# Patient Record
Sex: Male | Born: 1985 | Race: White | Hispanic: No | Marital: Married | Smoking: Never smoker
Health system: Southern US, Community
[De-identification: ages and names within clinical notes are randomized; demographics above are authoritative.]

## PROBLEM LIST (undated history)

## (undated) HISTORY — PX: APPENDECTOMY: SHX54

---

## 2019-05-18 DIAGNOSIS — M79671 Pain in right foot: Secondary | ICD-10-CM | POA: Diagnosis not present

## 2019-05-18 DIAGNOSIS — Z6824 Body mass index (BMI) 24.0-24.9, adult: Secondary | ICD-10-CM | POA: Diagnosis not present

## 2019-08-23 DIAGNOSIS — Z20828 Contact with and (suspected) exposure to other viral communicable diseases: Secondary | ICD-10-CM | POA: Diagnosis not present

## 2019-11-23 DIAGNOSIS — Z6823 Body mass index (BMI) 23.0-23.9, adult: Secondary | ICD-10-CM | POA: Diagnosis not present

## 2019-11-23 DIAGNOSIS — Z0000 Encounter for general adult medical examination without abnormal findings: Secondary | ICD-10-CM | POA: Diagnosis not present

## 2019-11-24 DIAGNOSIS — Z136 Encounter for screening for cardiovascular disorders: Secondary | ICD-10-CM | POA: Diagnosis not present

## 2019-11-24 DIAGNOSIS — Z8249 Family history of ischemic heart disease and other diseases of the circulatory system: Secondary | ICD-10-CM | POA: Diagnosis not present

## 2019-11-24 DIAGNOSIS — Z13220 Encounter for screening for lipoid disorders: Secondary | ICD-10-CM | POA: Diagnosis not present

## 2019-11-24 DIAGNOSIS — Z79899 Other long term (current) drug therapy: Secondary | ICD-10-CM | POA: Diagnosis not present

## 2019-12-15 DIAGNOSIS — Z3141 Encounter for fertility testing: Secondary | ICD-10-CM | POA: Diagnosis not present

## 2020-01-17 DIAGNOSIS — Z23 Encounter for immunization: Secondary | ICD-10-CM | POA: Diagnosis not present

## 2020-02-15 DIAGNOSIS — Z23 Encounter for immunization: Secondary | ICD-10-CM | POA: Diagnosis not present

## 2020-03-22 DIAGNOSIS — L821 Other seborrheic keratosis: Secondary | ICD-10-CM | POA: Diagnosis not present

## 2020-03-22 DIAGNOSIS — D485 Neoplasm of uncertain behavior of skin: Secondary | ICD-10-CM | POA: Diagnosis not present

## 2020-03-22 DIAGNOSIS — L988 Other specified disorders of the skin and subcutaneous tissue: Secondary | ICD-10-CM | POA: Diagnosis not present

## 2020-03-26 DIAGNOSIS — Z0183 Encounter for blood typing: Secondary | ICD-10-CM | POA: Diagnosis not present

## 2020-03-26 DIAGNOSIS — Z113 Encounter for screening for infections with a predominantly sexual mode of transmission: Secondary | ICD-10-CM | POA: Diagnosis not present

## 2020-04-11 DIAGNOSIS — Z6823 Body mass index (BMI) 23.0-23.9, adult: Secondary | ICD-10-CM | POA: Diagnosis not present

## 2020-04-11 DIAGNOSIS — R202 Paresthesia of skin: Secondary | ICD-10-CM | POA: Diagnosis not present

## 2020-05-29 DIAGNOSIS — Z113 Encounter for screening for infections with a predominantly sexual mode of transmission: Secondary | ICD-10-CM | POA: Diagnosis not present

## 2020-07-12 DIAGNOSIS — E538 Deficiency of other specified B group vitamins: Secondary | ICD-10-CM | POA: Diagnosis not present

## 2020-08-06 ENCOUNTER — Other Ambulatory Visit: Payer: Self-pay

## 2020-08-06 ENCOUNTER — Ambulatory Visit (INDEPENDENT_AMBULATORY_CARE_PROVIDER_SITE_OTHER): Payer: BC Managed Care – PPO | Admitting: Family Medicine

## 2020-08-06 ENCOUNTER — Encounter: Payer: Self-pay | Admitting: Family Medicine

## 2020-08-06 VITALS — BP 120/80 | HR 64 | Ht 75.0 in | Wt 201.0 lb

## 2020-08-06 DIAGNOSIS — S46811A Strain of other muscles, fascia and tendons at shoulder and upper arm level, right arm, initial encounter: Secondary | ICD-10-CM

## 2020-08-06 DIAGNOSIS — Z23 Encounter for immunization: Secondary | ICD-10-CM | POA: Diagnosis not present

## 2020-08-06 DIAGNOSIS — Z7689 Persons encountering health services in other specified circumstances: Secondary | ICD-10-CM | POA: Diagnosis not present

## 2020-08-06 DIAGNOSIS — M238X2 Other internal derangements of left knee: Secondary | ICD-10-CM

## 2020-08-06 MED ORDER — MELOXICAM 15 MG PO TABS: 15.0000 mg | ORAL_TABLET | Freq: Every day | ORAL | 0 refills | Status: DC

## 2020-08-06 NOTE — Progress Notes (Signed)
Date:  08/06/2020   Name:  Troy Logan   DOB:  1986/06/24   MRN:  [id number]   Chief Complaint: Establish Care, Flu Vaccine, Knee Pain, and Neck Pain (not really pain, just a "knot that comes up off and on")  Patient is a 34 year old male who presents for an establish care exam. The patient reports the following problems: posterior cervical "knot" and knee pain. Health maintenance has been reviewed influenza.  Knee Pain  The incident occurred more than 1 week ago. There was no injury mechanism. The pain is present in the left knee. The quality of the pain is described as aching. The pain is moderate. The pain has been intermittent since onset. Pertinent negatives include no inability to bear weight, loss of motion, loss of sensation, muscle weakness, numbness or tingling. The symptoms are aggravated by movement. The treatment provided moderate relief.  Neck Pain  Pertinent negatives include no chest pain, fever, headaches, numbness or tingling.    No results found for: CREATININE, BUN, NA, K, CL, CO2 No results found for: CHOL, HDL, LDLCALC, LDLDIRECT, TRIG, CHOLHDL No results found for: TSH No results found for: HGBA1C No results found for: WBC, HGB, HCT, MCV, PLT No results found for: ALT, AST, GGT, ALKPHOS, BILITOT   Review of Systems  Constitutional: Negative for chills and fever.  HENT: Negative for drooling, ear discharge, ear pain and sore throat.   Respiratory: Negative for cough, shortness of breath and wheezing.   Cardiovascular: Negative for chest pain, palpitations and leg swelling.  Gastrointestinal: Negative for abdominal pain, blood in stool, constipation, diarrhea and nausea.  Endocrine: Negative for polydipsia.  Genitourinary: Negative for dysuria, frequency, hematuria and urgency.  Musculoskeletal: Positive for neck pain. Negative for back pain and myalgias.  Skin: Negative for rash.  Allergic/Immunologic: Negative for environmental allergies.  Neurological:  Negative for dizziness, tingling, numbness and headaches.  Hematological: Does not bruise/bleed easily.  Psychiatric/Behavioral: Negative for suicidal ideas. The patient is not nervous/anxious.     There are no problems to display for this patient.   No Known Allergies    Social History   Tobacco Use  . Smoking status: Never Smoker  . Smokeless tobacco: Never Used  Substance Use Topics  . Alcohol use: Yes    Comment: socially  . Drug use: Not Currently     Medication list has been reviewed and updated.  Current Meds  Medication Sig  . Cyanocobalamin 1000 MCG CAPS Take 1,000 mcg by mouth every other day.     PHQ 2/9 Scores 08/06/2020  PHQ - 2 Score 0  PHQ- 9 Score 0    GAD 7 : Generalized Anxiety Score 08/06/2020  Nervous, Anxious, on Edge 0  Control/stop worrying 0  Worry too much - different things 0  Trouble relaxing 0  Restless 0  Easily annoyed or irritable 0  Afraid - awful might happen 0  Total GAD 7 Score 0    BP Readings from Last 3 Encounters:  08/06/20 120/80    Physical Exam Vitals and nursing note reviewed.  HENT:     Head: Normocephalic.     Right Ear: Tympanic membrane, ear canal and external ear normal.     Left Ear: Tympanic membrane, ear canal and external ear normal.     Nose: Nose normal. No congestion or rhinorrhea.     Mouth/Throat:     Mouth: Mucous membranes are moist.     Pharynx: No oropharyngeal exudate or posterior oropharyngeal  erythema.  Eyes:     General: No scleral icterus.       Right eye: No discharge.        Left eye: No discharge.     Conjunctiva/sclera: Conjunctivae normal.     Pupils: Pupils are equal, round, and reactive to light.  Neck:     Thyroid: No thyromegaly.     Vascular: No JVD.     Trachea: No tracheal deviation.  Cardiovascular:     Rate and Rhythm: Normal rate and regular rhythm.     Heart sounds: Normal heart sounds. No murmur heard.  No friction rub. No gallop.   Pulmonary:     Effort: No  respiratory distress.     Breath sounds: Normal breath sounds. No wheezing, rhonchi or rales.  Abdominal:     General: Bowel sounds are normal.     Palpations: Abdomen is soft. There is no mass.     Tenderness: There is no abdominal tenderness. There is no guarding or rebound.  Musculoskeletal:        General: No tenderness. Normal range of motion.     Cervical back: Normal range of motion and neck supple.  Lymphadenopathy:     Cervical: No cervical adenopathy.  Skin:    General: Skin is warm.     Findings: No bruising, erythema or rash.  Neurological:     Mental Status: He is alert and oriented to person, place, and time.     Cranial Nerves: No cranial nerve deficit.     Deep Tendon Reflexes: Reflexes are normal and symmetric.     Wt Readings from Last 3 Encounters:  08/06/20 201 lb (91.2 kg)    BP 120/80   Pulse 64   Ht 6\' 3"  (1.905 m)   Wt 201 lb (91.2 kg)   BMI 25.12 kg/m   Assessment and Plan: 1. Establishing care with new doctor, encounter for No subjective/objective concerns noted with history and physical exam.  Patient's chart was reviewed for previous encounters, most recent labs, most recent imaging, and care everywhere.  2. Strain of right trapezius muscle, initial encounter Relatively new onset.  Patient has noted a "knot "in the posterior cervical area that comes and goes.  There is no palpable area and this is in a possible posterior chain area but is no palpable adenopathy noted at this time.  I suspect this has more to do with the trapezius and may be an area spasm that is noted by the patient.  Patient will be given meloxicam for concern below and this may also help this as well.  3. Knee crepitus, left New onset.  Patient has a sensation of feeling of his left knee which is positional.  There is no real pain however this could be secondary to some meniscal concern that is starting to develop in early stages.  We will initiate meloxicam 15 mg once a day for  decreasing inflammatory response for a trial basis. - meloxicam (MOBIC) 15 MG tablet; Take 1 tablet (15 mg total) by mouth daily.  Dispense: 30 tablet; Refill: 0  4. Need for immunization against influenza Discussed and administered. - Flu Vaccine QUAD 36+ mos IM

## 2020-08-06 NOTE — Patient Instructions (Signed)
Articular Cartilage Injury  An articular cartilage injury is damage to the cartilage that lines the surface of joints (articular cartilage). Articular cartilage is also called hyaline cartilage. The cartilage is smooth, white tissue that covers the ends of bones where they meet at a joint. This cartilage allows smooth movement of the joint. It also acts as a thin cushion between the bones of the joint. Injuries to joint cartilage can cause pain and decrease range of movement. Articular cartilage damage can occur from a recent (acute) injury or from wear and tear that takes place over time. The knee joint is the most common area for this type of injury. Articular cartilage injuries are also common in the ankle, shoulder, elbow, and hip. What are the causes? This injury may be caused by:  A fall.  A hard, direct hit to the joint.  Long-term wear and tear of a joint.  Arthritis.  Other injuries, such as: ? Joint fracture or dislocation. ? A tear of joint cartilage. What increases the risk? The following factors may make you more likely to develop this condition:  Older age.  Participating in sports.  Not being active.  Being overweight. What are the signs or symptoms? Symptoms of this condition include:  Pain and swelling in the joint.  Giving way or locking of the joint.  Decreased range of motion of the joint.  A crackling or clicking sound within the joint when it moves. How is this diagnosed? This condition may be diagnosed based on your symptoms, your history of injury, and a physical exam. You may also have tests, such as:  MRI.  A procedure in which a thin scope is used to check inside the joint (arthroscopy).  X-rays to help rule out other conditions that may be similar to an articular cartilage injury. How is this treated? Treatment depends on the severity of the injury and the joint that is involved. Treatment options include:  Resting the joint.  Icing the  joint.  Medicines that reduce swelling and pain (NSAIDs).  Injecting a steroid medicine into the joint.  Wearing a splint, brace, or sling.  Physical therapy.  Surgery. This may involve any of the following: ? Drilling holes through the cartilage into the bone underneath it to improve blood flow. ? Removing torn or damaged cartilage. ? Replacing cartilage with a type of cartilage graft. ? Replacing the entire joint. Follow these instructions at home: If you have a splint, brace, or sling:  Wear it as told by your health care provider. Remove it only as told by your health care provider.  Loosen the splint or brace if your fingers or toes tingle, become numb, or turn cold and blue.  Keep the splint, brace, or sling clean.  If the splint, brace, or sling is not waterproof: ? Do not let it get wet. ? Cover it with a watertight covering when you take a bath or shower. Managing pain, stiffness, and swelling      If directed, put ice on the injured area: ? Put ice in a plastic bag. ? Place a towel between your skin and the bag. ? Leave the ice on for 20 minutes, 2-3 times per day.  If directed, apply heat to the affected area before you exercise. Use the heat source that your health care provider recommends, such as a moist heat pack or a heating pad. ? Place a towel between your skin and the heat source. ? Leave the heat on for 20-30 minutes. ?  Remove the heat if your skin turns bright red. This is especially important if you are unable to feel pain, heat, or cold. You may have a greater risk of getting burned. Activity  Return to your normal activities as told by your health care provider. Ask your health care provider what activities are safe for you.  Try doing low-impact exercise if told by your health care provider. This may include: ? Swimming. ? Water aerobics. ? Biking. Driving  Ask your health care provider when it is safe to drive if you have a splint, brace,  or sling on your joint.  Do not drive or use heavy machinery while taking prescription pain medicine. General instructions  If you have a splint, do not put pressure on any part of the splint until it is fully hardened. This may take several hours.  Do not use any products that contain nicotine or tobacco, such as cigarettes, e-cigarettes, and chewing tobacco. These can delay healing. If you need help quitting, ask your health care provider.  Take over-the-counter and prescription medicines only as told by your health care provider.  If you are overweight, work with your health care provider and a dietitian to set a weight-loss goal that is healthy and reasonable for you.  Keep all follow-up visits as told by your health care provider. This is important. How is this prevented?  Maintain physical fitness, including strength and flexibility of the muscles around your joints.  Warm up and stretch properly before doing physical activity.  Use protective equipment that fits properly.  Wear proper footwear for the physical activity that you are doing. Contact a health care provider if:  Your symptoms do not improve in 2 weeks even though you have had treatment.  You have increased pain, swelling, or stiffness. Summary  An articular cartilage injury is damage to the cartilage that lines the surface of joints (articular cartilage).  Treatment depends on the severity of the injury and the joint that is involved.  Follow instructions as told by your health care provider for managing pain, stiffness, and swelling.  Contact a health care provider if you have increased pain, swelling, or stiffness.  Keep all follow-up visits as told by your health care provider. This is important. This information is not intended to replace advice given to you by your health care provider. Make sure you discuss any questions you have with your health care provider. Document Revised: 04/14/2018 Document  Reviewed: 04/14/2018 Elsevier Patient Education  2020 [employer].

## 2020-08-21 DIAGNOSIS — Z20822 Contact with and (suspected) exposure to covid-19: Secondary | ICD-10-CM | POA: Diagnosis not present

## 2020-10-10 ENCOUNTER — Other Ambulatory Visit: Payer: Self-pay

## 2020-10-10 ENCOUNTER — Telehealth: Payer: Self-pay

## 2020-10-10 DIAGNOSIS — M238X2 Other internal derangements of left knee: Secondary | ICD-10-CM

## 2020-10-10 DIAGNOSIS — Z20822 Contact with and (suspected) exposure to covid-19: Secondary | ICD-10-CM | POA: Diagnosis not present

## 2020-10-10 MED ORDER — MELOXICAM 15 MG PO TABS: 15.0000 mg | ORAL_TABLET | Freq: Every day | ORAL | 0 refills | Status: DC

## 2020-10-10 NOTE — Telephone Encounter (Unsigned)
Copied from CRM [id number]. Topic: Quick Communication - See Telephone Encounter >> Oct 10, 2020  8:15 AM Aretta Nip wrote: CRM for notification. See Telephone encounter for: 10/10/20. Pt right shoulder hurts to move,  like pinched nerve. Pt could not sleep all nite, if possible to work in with Dr B. Pt would be willing to come in anytime. [phone]

## 2020-10-10 NOTE — Telephone Encounter (Signed)
Spoke to pt, will take medication and call back if not better to set up appt with dr. Yetta Barre

## 2020-10-19 DIAGNOSIS — Z20822 Contact with and (suspected) exposure to covid-19: Secondary | ICD-10-CM | POA: Diagnosis not present

## 2020-12-07 ENCOUNTER — Encounter: Payer: Self-pay | Admitting: Family Medicine

## 2020-12-07 ENCOUNTER — Other Ambulatory Visit: Payer: Self-pay

## 2020-12-07 ENCOUNTER — Ambulatory Visit (INDEPENDENT_AMBULATORY_CARE_PROVIDER_SITE_OTHER): Payer: BC Managed Care – PPO | Admitting: Family Medicine

## 2020-12-07 VITALS — BP 102/80 | HR 72 | Ht 75.0 in | Wt 200.0 lb

## 2020-12-07 DIAGNOSIS — Z0000 Encounter for general adult medical examination without abnormal findings: Secondary | ICD-10-CM

## 2020-12-07 DIAGNOSIS — E538 Deficiency of other specified B group vitamins: Secondary | ICD-10-CM

## 2020-12-07 DIAGNOSIS — R1011 Right upper quadrant pain: Secondary | ICD-10-CM

## 2020-12-07 NOTE — Progress Notes (Signed)
Annual phy   Date:  12/07/2020   Name:  Troy Logan   DOB:  June 29, 1986   MRN:  [id number]   Chief Complaint: Annual Exam  Patient is a 35 year old male who presents for a comprehensive physical exam. The patient reports the following problems: none. Health maintenance has been reviewed up to date.  Abdominal Pain This is a new problem. The current episode started more than 1 month ago (6 months). The onset quality is gradual. The problem occurs intermittently. The problem has been waxing and waning. The pain is located in the RUQ. The pain is mild. The abdominal pain does not radiate. Pertinent negatives include no anorexia, arthralgias, belching, constipation, diarrhea, dysuria, fever, flatus, frequency, headaches, hematochezia, hematuria, melena, myalgias, nausea, vomiting or weight loss. Exacerbated by: lifting. The pain is relieved by nothing.    No results found for: CREATININE, BUN, NA, K, CL, CO2 No results found for: CHOL, HDL, LDLCALC, LDLDIRECT, TRIG, CHOLHDL No results found for: TSH No results found for: HGBA1C No results found for: WBC, HGB, HCT, MCV, PLT No results found for: ALT, AST, GGT, ALKPHOS, BILITOT   Review of Systems  Constitutional: Negative for chills, fever and weight loss.  HENT: Negative for drooling, ear discharge, ear pain and sore throat.   Respiratory: Negative for cough, shortness of breath and wheezing.   Cardiovascular: Negative for chest pain, palpitations and leg swelling.  Gastrointestinal: Negative for abdominal pain, anorexia, blood in stool, constipation, diarrhea, flatus, hematochezia, melena, nausea and vomiting.  Endocrine: Negative for polydipsia.  Genitourinary: Negative for dysuria, frequency, hematuria and urgency.  Musculoskeletal: Negative for arthralgias, back pain, myalgias and neck pain.  Skin: Negative for rash.  Allergic/Immunologic: Negative for environmental allergies.  Neurological: Negative for dizziness and headaches.   Hematological: Does not bruise/bleed easily.  Psychiatric/Behavioral: Negative for suicidal ideas. The patient is not nervous/anxious.     There are no problems to display for this patient.   No Known Allergies  Past Surgical History:  Procedure Laterality Date  . APPENDECTOMY      Social History   Tobacco Use  . Smoking status: Never Smoker  . Smokeless tobacco: Never Used  Substance Use Topics  . Alcohol use: Yes    Comment: socially  . Drug use: Not Currently     Medication list has been reviewed and updated.  Current Meds  Medication Sig  . Cyanocobalamin 1000 MCG CAPS Take 1,000 mcg by mouth every other day.     PHQ 2/9 Scores 12/07/2020 08/06/2020  PHQ - 2 Score 0 0  PHQ- 9 Score 0 0    GAD 7 : Generalized Anxiety Score 12/07/2020 08/06/2020  Nervous, Anxious, on Edge 0 0  Control/stop worrying 0 0  Worry too much - different things 0 0  Trouble relaxing 0 0  Restless 0 0  Easily annoyed or irritable 0 0  Afraid - awful might happen 0 0  Total GAD 7 Score 0 0    BP Readings from Last 3 Encounters:  12/07/20 102/80  08/06/20 120/80    Physical Exam Vitals and nursing note reviewed.  Constitutional:      Appearance: Normal appearance. He is well-developed and well-groomed.  HENT:     Head: Normocephalic.     Jaw: There is normal jaw occlusion.     Right Ear: Hearing, tympanic membrane, ear canal and external ear normal.     Left Ear: Hearing, tympanic membrane, ear canal and external ear normal.  Nose: Nose normal.     Right Turbinates: Not swollen.     Left Turbinates: Not swollen.     Mouth/Throat:     Lips: Pink.     Mouth: Oropharynx is clear and moist. Mucous membranes are moist.     Tongue: No lesions. Tongue does not deviate from midline.     Palate: No mass and lesions.     Pharynx: Oropharynx is clear. Uvula midline.  Eyes:     General: Lids are normal. Lids are everted, no foreign bodies appreciated. Vision grossly intact. Gaze  aligned appropriately. No scleral icterus.       Right eye: No discharge.        Left eye: No discharge.     Extraocular Movements: Extraocular movements intact and EOM normal.     Right eye: Normal extraocular motion.     Left eye: Normal extraocular motion.     Conjunctiva/sclera: Conjunctivae normal.     Pupils: Pupils are equal, round, and reactive to light.  Neck:     Thyroid: No thyroid mass, thyromegaly or thyroid tenderness.     Vascular: Normal carotid pulses. No carotid bruit, hepatojugular reflux or JVD.     Trachea: Trachea and phonation normal. No tracheal tenderness or tracheal deviation.  Cardiovascular:     Rate and Rhythm: Normal rate and regular rhythm.     Chest Wall: PMI is not displaced. No thrill.     Pulses: Normal pulses and intact distal pulses.          Carotid pulses are 2+ on the right side and 2+ on the left side.      Radial pulses are 2+ on the right side and 2+ on the left side.       Femoral pulses are 2+ on the right side and 2+ on the left side.      Popliteal pulses are 2+ on the right side and 2+ on the left side.       Dorsalis pedis pulses are 2+ on the right side and 2+ on the left side.       Posterior tibial pulses are 2+ on the right side and 2+ on the left side.     Heart sounds: Normal heart sounds, S1 normal and S2 normal. No murmur heard.  No systolic murmur is present.  No diastolic murmur is present. No friction rub. No gallop. No S3 or S4 sounds.   Pulmonary:     Effort: No respiratory distress.     Breath sounds: Normal breath sounds. No decreased air movement. No decreased breath sounds, wheezing, rhonchi or rales.  Chest:  Breasts: Breasts are symmetrical.     Right: Normal. No mass, axillary adenopathy or supraclavicular adenopathy.     Left: Normal. No mass, axillary adenopathy or supraclavicular adenopathy.    Abdominal:     General: Bowel sounds are normal.     Palpations: Abdomen is soft. There is no hepatomegaly,  splenomegaly, hepatosplenomegaly or mass.     Tenderness: There is no abdominal tenderness. There is no CVA tenderness, guarding or rebound.     Hernia: There is no hernia in the umbilical area, ventral area, left inguinal area or right inguinal area.  Genitourinary:    Penis: Normal. No tenderness.      Testes: Normal.        Right: Mass not present.        Left: Mass not present.     Epididymis:     Right:  Normal.     Left: Normal.  Musculoskeletal:        General: No tenderness or edema. Normal range of motion.     Cervical back: Full passive range of motion without pain, normal range of motion and neck supple.     Right lower leg: No edema.     Left lower leg: No edema.  Lymphadenopathy:     Head:     Right side of head: No submandibular or tonsillar adenopathy.     Left side of head: No submandibular or tonsillar adenopathy.     Cervical: No cervical adenopathy.     Right cervical: No superficial, deep or posterior cervical adenopathy.    Left cervical: No superficial, deep or posterior cervical adenopathy.     Upper Body:     Right upper body: No supraclavicular, axillary or pectoral adenopathy.     Left upper body: No supraclavicular, axillary or pectoral adenopathy.     Lower Body: No right inguinal adenopathy. No left inguinal adenopathy.  Skin:    General: Skin is warm.     Capillary Refill: Capillary refill takes less than 2 seconds.     Findings: No rash.  Neurological:     Mental Status: He is alert and oriented to person, place, and time.     Cranial Nerves: Cranial nerves are intact. No cranial nerve deficit.     Sensory: Sensation is intact.     Motor: Motor function is intact.     Deep Tendon Reflexes: Strength normal and reflexes are normal and symmetric.     Reflex Scores:      Tricep reflexes are 2+ on the right side and 2+ on the left side.      Bicep reflexes are 2+ on the right side and 2+ on the left side.      Brachioradialis reflexes are 2+ on the  right side and 2+ on the left side.      Patellar reflexes are 2+ on the right side and 2+ on the left side.      Achilles reflexes are 2+ on the right side and 2+ on the left side. Psychiatric:        Behavior: Behavior is cooperative.     Wt Readings from Last 3 Encounters:  12/07/20 200 lb (90.7 kg)  08/06/20 201 lb (91.2 kg)    BP 102/80   Pulse 72   Ht 6\' 3"  (1.905 m)   Wt 200 lb (90.7 kg)   BMI 25.00 kg/m   Assessment and Plan: 1. Annual physical exam No subjective/objective concerns noted on history and physical exam other than noted below.Jermiah Chapla is a 35 y.o. male who presents today for his Complete Annual Exam. He feels well. He reports exercising . He reports he is sleeping well. Immunizations are reviewed and recommendations provided.   Age appropriate screening tests are discussed. Counseling given for risk factor reduction interventions.  We will obtain a renal function panel with lipid panel. - Renal Function Panel - Lipid Panel With LDL/HDL Ratio  2. Right upper quadrant abdominal pain New onset.  Patient is noted over a 95-month.  Off-and-on discomfort in the right upper quadrant.  He is attributed this to lifting and twisting and it may be musculoskeletal but I explained to him that given his history of lab abnormalities that we probably need to take a closer look at the liver and pancreas.  We will obtain a hepatic function panel along with lipase as well  as an ultrasound limited to the right upper quadrant to take a look given his history of hyperbilirubinemia. - Renal Function Panel - Hepatic Function Panel (6) - Lipase - US Abdomen Limited RUQ (LIVER/GB); Future  3. Hyperbilirubinemia As noted above 2 elevated circumstances of hyperbilirubinemia which patient was told given that family member has a similar issue and is not considered of major concern. - Hepatic Function Panel (6) - US Abdomen Limited RUQ (LIVER/GB); Future  4. B12 deficiency Patient is  taking B12 secondary to a mild decrease in his B12 of 192 cut off in the low 200s.  We will check a CBC to rule out macrocytic anemia. - CBC with Differential/Platelet

## 2020-12-08 LAB — HEPATIC FUNCTION PANEL (6)
ALT: 13 IU/L (ref 0–44)
AST: 11 IU/L (ref 0–40)
Alkaline Phosphatase: 84 IU/L (ref 44–121)
Bilirubin Total: 2.1 mg/dL — ABNORMAL HIGH (ref 0.0–1.2)
Bilirubin, Direct: 0.37 mg/dL (ref 0.00–0.40)

## 2020-12-08 LAB — CBC WITH DIFFERENTIAL/PLATELET
Basophils Absolute: 0 10*3/uL (ref 0.0–0.2)
Basos: 1 %
EOS (ABSOLUTE): 0.1 10*3/uL (ref 0.0–0.4)
Eos: 2 %
Hematocrit: 44.3 % (ref 37.5–51.0)
Hemoglobin: 15.5 g/dL (ref 13.0–17.7)
Immature Grans (Abs): 0 10*3/uL (ref 0.0–0.1)
Immature Granulocytes: 0 %
Lymphocytes Absolute: 0.9 10*3/uL (ref 0.7–3.1)
Lymphs: 22 %
MCH: 32 pg (ref 26.6–33.0)
MCHC: 35 g/dL (ref 31.5–35.7)
MCV: 92 fL (ref 79–97)
Monocytes Absolute: 0.3 10*3/uL (ref 0.1–0.9)
Monocytes: 8 %
Neutrophils Absolute: 2.7 10*3/uL (ref 1.4–7.0)
Neutrophils: 67 %
Platelets: 254 10*3/uL (ref 150–450)
RBC: 4.84 x10E6/uL (ref 4.14–5.80)
RDW: 12.6 % (ref 11.6–15.4)
WBC: 4 10*3/uL (ref 3.4–10.8)

## 2020-12-08 LAB — LIPID PANEL WITH LDL/HDL RATIO
Cholesterol, Total: 177 mg/dL (ref 100–199)
HDL: 69 mg/dL (ref >39)
LDL Chol Calc (NIH): 99 mg/dL (ref 0–99)
LDL/HDL Ratio: 1.4 ratio (ref 0.0–3.6)
Triglycerides: 44 mg/dL (ref 0–149)
VLDL Cholesterol Cal: 9 mg/dL (ref 5–40)

## 2020-12-08 LAB — RENAL FUNCTION PANEL
Albumin: 5.3 g/dL — ABNORMAL HIGH (ref 4.0–5.0)
BUN/Creatinine Ratio: 9 (ref 9–20)
BUN: 9 mg/dL (ref 6–20)
CO2: 25 mmol/L (ref 20–29)
Calcium: 9.7 mg/dL (ref 8.7–10.2)
Chloride: 104 mmol/L (ref 96–106)
Creatinine, Ser: 0.98 mg/dL (ref 0.76–1.27)
GFR calc Af Amer: 116 mL/min/{1.73_m2} (ref >59)
GFR calc non Af Amer: 100 mL/min/{1.73_m2} (ref >59)
Glucose: 86 mg/dL (ref 65–99)
Phosphorus: 4.2 mg/dL — ABNORMAL HIGH (ref 2.8–4.1)
Potassium: 4.5 mmol/L (ref 3.5–5.2)
Sodium: 144 mmol/L (ref 134–144)

## 2020-12-08 LAB — LIPASE: Lipase: 18 U/L (ref 13–78)

## 2020-12-11 ENCOUNTER — Other Ambulatory Visit: Payer: Self-pay

## 2020-12-11 ENCOUNTER — Ambulatory Visit
Admission: RE | Admit: 2020-12-11 | Discharge: 2020-12-11 | Disposition: A | Discharge status: Home / Self Care | Payer: BC Managed Care – PPO | Source: Ambulatory Visit | Attending: Family Medicine | Admitting: Family Medicine

## 2020-12-11 DIAGNOSIS — R1011 Right upper quadrant pain: Secondary | ICD-10-CM | POA: Diagnosis not present

## 2020-12-11 DIAGNOSIS — R109 Unspecified abdominal pain: Secondary | ICD-10-CM | POA: Diagnosis not present

## 2021-05-10 ENCOUNTER — Ambulatory Visit
Admission: RE | Admit: 2021-05-10 | Discharge: 2021-05-10 | Disposition: A | Discharge status: Home / Self Care | Payer: 59 | Source: Ambulatory Visit | Attending: Family Medicine | Admitting: Family Medicine

## 2021-05-10 ENCOUNTER — Ambulatory Visit: Payer: 59 | Admitting: Family Medicine

## 2021-05-10 ENCOUNTER — Ambulatory Visit
Admission: RE | Admit: 2021-05-10 | Discharge: 2021-05-10 | Disposition: A | Discharge status: Home / Self Care | Payer: 59 | Attending: Family Medicine | Admitting: Family Medicine

## 2021-05-10 ENCOUNTER — Encounter: Payer: Self-pay | Admitting: Family Medicine

## 2021-05-10 ENCOUNTER — Other Ambulatory Visit: Payer: Self-pay

## 2021-05-10 VITALS — BP 120/70 | HR 68 | Ht 75.0 in | Wt 200.0 lb

## 2021-05-10 DIAGNOSIS — M7671 Peroneal tendinitis, right leg: Secondary | ICD-10-CM

## 2021-05-10 MED ORDER — MELOXICAM 15 MG PO TABS
15.0000 mg | ORAL_TABLET | Freq: Every day | ORAL | 0 refills | Status: DC
Start: 2021-05-10 — End: 2021-07-04

## 2021-05-10 NOTE — Progress Notes (Signed)
Date:  05/10/2021   Name:  Troy Logan   DOB:  1986/02/04   MRN:  [id number]   Chief Complaint: Ankle Pain (Started hurting x 2 weeks ago- doesn't know of anything he has done.)  Ankle Pain  The incident occurred more than 1 week ago (2 weeks ago). The pain is present in the right ankle. The pain is moderate. The pain has been Fluctuating since onset. Pertinent negatives include no inability to bear weight, loss of motion, loss of sensation, muscle weakness, numbness or tingling. The symptoms are aggravated by movement, palpation and weight bearing. He has tried ice and NSAIDs (voltoren gel) for the symptoms. The treatment provided mild relief.   Lab Results  Component Value Date   CREATININE 0.98 12/07/2020   BUN 9 12/07/2020   NA 144 12/07/2020   K 4.5 12/07/2020   CL 104 12/07/2020   CO2 25 12/07/2020   Lab Results  Component Value Date   CHOL 177 12/07/2020   HDL 69 12/07/2020   LDLCALC 99 12/07/2020   TRIG 44 12/07/2020   No results found for: TSH No results found for: HGBA1C Lab Results  Component Value Date   WBC 4.0 12/07/2020   HGB 15.5 12/07/2020   HCT 44.3 12/07/2020   MCV 92 12/07/2020   PLT 254 12/07/2020   Lab Results  Component Value Date   ALT 13 12/07/2020   AST 11 12/07/2020   ALKPHOS 84 12/07/2020   BILITOT 2.1 (H) 12/07/2020     Review of Systems  Constitutional:  Negative for chills and fever.  HENT:  Negative for drooling, ear discharge, ear pain and sore throat.   Respiratory:  Negative for cough, shortness of breath and wheezing.   Cardiovascular:  Negative for chest pain, palpitations and leg swelling.  Gastrointestinal:  Negative for abdominal pain, blood in stool, constipation, diarrhea and nausea.  Endocrine: Negative for polydipsia.  Genitourinary:  Negative for dysuria, frequency, hematuria and urgency.  Musculoskeletal:  Negative for back pain, myalgias and neck pain.  Skin:  Negative for rash.  Allergic/Immunologic: Negative  for environmental allergies.  Neurological:  Negative for dizziness, tingling, numbness and headaches.  Hematological:  Does not bruise/bleed easily.  Psychiatric/Behavioral:  Negative for suicidal ideas. The patient is not nervous/anxious.    There are no problems to display for this patient.   No Known Allergies  Past Surgical History:  Procedure Laterality Date   APPENDECTOMY      Social History   Tobacco Use   Smoking status: Never   Smokeless tobacco: Never  Substance Use Topics   Alcohol use: Yes    Comment: socially   Drug use: Not Currently     Medication list has been reviewed and updated.  Current Meds  Medication Sig   [DISCONTINUED] Cyanocobalamin 1000 MCG CAPS Take 1,000 mcg by mouth every other day.     PHQ 2/9 Scores 05/10/2021 12/07/2020 08/06/2020  PHQ - 2 Score 0 0 0  PHQ- 9 Score 0 0 0    GAD 7 : Generalized Anxiety Score 05/10/2021 12/07/2020 08/06/2020  Nervous, Anxious, on Edge 0 0 0  Control/stop worrying 0 0 0  Worry too much - different things 0 0 0  Trouble relaxing 0 0 0  Restless 0 0 0  Easily annoyed or irritable 0 0 0  Afraid - awful might happen 0 0 0  Total GAD 7 Score 0 0 0    BP Readings from Last 3 Encounters:  05/10/21  120/70  12/07/20 102/80  08/06/20 120/80    Physical Exam Vitals and nursing note reviewed.  HENT:     Head: Normocephalic.     Right Ear: Tympanic membrane and external ear normal.     Left Ear: Tympanic membrane and external ear normal.     Nose: Nose normal.  Eyes:     General: No scleral icterus.       Right eye: No discharge.        Left eye: No discharge.     Conjunctiva/sclera: Conjunctivae normal.     Pupils: Pupils are equal, round, and reactive to light.  Neck:     Thyroid: No thyromegaly.     Vascular: No JVD.     Trachea: No tracheal deviation.  Cardiovascular:     Rate and Rhythm: Normal rate and regular rhythm.     Heart sounds: Normal heart sounds. No murmur heard.   No friction rub.  No gallop.  Pulmonary:     Effort: No respiratory distress.     Breath sounds: Normal breath sounds. No wheezing or rales.  Abdominal:     General: Bowel sounds are normal.     Palpations: Abdomen is soft. There is no mass.     Tenderness: There is no abdominal tenderness. There is no guarding or rebound.  Musculoskeletal:        General: No tenderness. Normal range of motion.     Cervical back: Normal range of motion and neck supple.     Right ankle: Swelling present. No tenderness. Normal range of motion.     Right Achilles Tendon: Normal.  Lymphadenopathy:     Cervical: No cervical adenopathy.  Skin:    General: Skin is warm.     Findings: No rash.  Neurological:     Mental Status: He is alert and oriented to person, place, and time.     Cranial Nerves: No cranial nerve deficit.     Deep Tendon Reflexes: Reflexes are normal and symmetric.    Wt Readings from Last 3 Encounters:  05/10/21 200 lb (90.7 kg)  12/07/20 200 lb (90.7 kg)  08/06/20 201 lb (91.2 kg)    BP 120/70   Pulse 68   Ht 6\' 3"  (1.905 m)   Wt 200 lb (90.7 kg)   BMI 25.00 kg/m   Assessment and Plan:

## 2021-05-21 ENCOUNTER — Encounter: Payer: 59 | Admitting: Family Medicine

## 2021-05-28 ENCOUNTER — Ambulatory Visit: Payer: 59 | Admitting: Family Medicine

## 2021-05-28 ENCOUNTER — Other Ambulatory Visit: Payer: Self-pay

## 2021-05-28 ENCOUNTER — Encounter: Payer: Self-pay | Admitting: Family Medicine

## 2021-05-28 VITALS — BP 122/72 | HR 73 | Temp 98.3°F | Ht 75.0 in | Wt 202.0 lb

## 2021-05-28 DIAGNOSIS — M25571 Pain in right ankle and joints of right foot: Secondary | ICD-10-CM

## 2021-05-28 NOTE — Assessment & Plan Note (Addendum)
Atraumatic right lateral ankle pain noted roughly 3-4 weeks prior while running.  Patient is an avid runner and has been restarting his athletic running regiment over the past several months, no significant ramp-up.  Normally runs on the sidewalk, began to note lateral ankle pain every 100 m or so, did not interrupt his ROM fully until they persisted.  Denies any swelling, no ecchymosis, no radiation to the foot or proximal.  He denies any focal tenderness.  Physical exam reveals pes cavus bilaterally, no other abnormalities to inspection, minimally tender at the ATFL, full body weight resisted peroneal testing is benign, no laxity with drawer testing, single hop test is benign.  Proprioception testing reveals mild deficiency on the right when compared to the contralateral.  His overall exam findings are reassuring, he is currently asymptomatic, this is after a course of meloxicam.  Given the prominent pes cavus, lateral foot/ankle findings, concern for overuse related tendinopathy at the peroneals.  I have advised discontinuation of meloxicam with reserve usage if symptoms return.  Medication management performed. Initiation of home-based rehab with foot/ankle conditioning program, graded return to running after 2 weeks of relative rest, and close follow-up in 4 weeks.  I did advise him that as he is due for a new pair of shoes, he is to obtain 1 with appropriate arch support that allows neutral gait, local running shoe stores were advised.  Pending symptoms at return, formal PT, restart of medications, advanced imaging, to all be considered.

## 2021-05-28 NOTE — Patient Instructions (Signed)
-   Stop meloxicam - Start home exercises and perform daily until follow-up - In 2 weeks, start and progressively advance run mileage as discussed - Contact for any recurrence of pain (and stop running) - Return in 4 weeks

## 2021-05-28 NOTE — Progress Notes (Signed)
Primary Care / Sports Medicine Office Visit  Patient Information:  Patient ID: Troy Logan, male DOB: [birth year] Age: 35 y.o. MRN: [id number]   Troy Logan is a pleasant 35 y.o. male presenting with the following:  Chief Complaint  Patient presents with   Ankle Pain    Right ankle, pt is a runner    Review of Systems pertinent details above   Patient Active Problem List   Diagnosis Date Noted   Pain in lateral portion of right ankle 05/28/2021   History reviewed. No pertinent past medical history. Outpatient Encounter Medications as of 05/28/2021  Medication Sig   meloxicam (MOBIC) 15 MG tablet Take 1 tablet (15 mg total) by mouth daily.   No facility-administered encounter medications on file as of 05/28/2021.   Past Surgical History:  Procedure Laterality Date   APPENDECTOMY      Vitals:   05/28/21 1356  BP: 122/72  Pulse: 73  Temp: 98.3 F (36.8 C)  SpO2: 96%   Vitals:   05/28/21 1356  Weight: 202 lb (91.6 kg)  Height: 6\' 3"  (1.905 m)   Body mass index is 25.25 kg/m.  DG Ankle Complete Right  Result Date: 05/13/2021 CLINICAL DATA:  Lateral right ankle pain for 2 weeks. EXAM: RIGHT ANKLE - COMPLETE 3+ VIEW COMPARISON:  None. FINDINGS: There is no evidence of fracture, dislocation, or joint effusion. There is no evidence of arthropathy or other focal bone abnormality. Soft tissues are unremarkable. IMPRESSION: Negative. Electronically Signed   By: 07/14/2021 M.D.   On: 05/13/2021 19:02     Independent interpretation of notes and tests performed by another provider:   Independent interpretation reveals maintained joint spaces throughout, mortise maintained, no degenerative changes noted, no acute osseous processes noted, soft tissue shadows appear benign.  Procedures performed:   None  Pertinent History, Exam, Impression, and Recommendations:   Pain in lateral portion of right ankle Atraumatic right lateral ankle pain noted roughly 3-4 weeks prior  while running.  Patient is an avid runner and has been restarting his athletic running regiment over the past several months, no significant ramp-up.  Normally runs on the sidewalk, began to note lateral ankle pain every 100 m or so, did not interrupt his ROM fully until they persisted.  Denies any swelling, no ecchymosis, no radiation to the foot or proximal.  He denies any focal tenderness.  Physical exam reveals pes cavus bilaterally, no other abnormalities to inspection, minimally tender at the ATFL, full body weight resisted peroneal testing is benign, no laxity with drawer testing, single hop test is benign.  Proprioception testing reveals mild deficiency on the right when compared to the contralateral.  His overall exam findings are reassuring, he is currently asymptomatic, this is after a course of meloxicam.  Given the prominent pes cavus, lateral foot/ankle findings, concern for overuse related tendinopathy at the peroneals.  I have advised discontinuation of meloxicam with reserve usage if symptoms return.  Medication management performed. Initiation of home-based rehab with foot/ankle conditioning program, graded return to running after 2 weeks of relative rest, and close follow-up in 4 weeks.  I did advise him that as he is due for a new pair of shoes, he is to obtain 1 with appropriate arch support that allows neutral gait, local running shoe stores were advised.  Pending symptoms at return, formal PT, restart of medications, advanced imaging, to all be considered.    Orders & Medications No orders of the defined types were  placed in this encounter.  No orders of the defined types were placed in this encounter.    Return in about 4 weeks (around 06/25/2021).     Jerrol Banana, MD   Primary Care Sports Medicine [facility]

## 2021-06-25 ENCOUNTER — Ambulatory Visit: Payer: 59 | Admitting: Family Medicine

## 2021-07-04 ENCOUNTER — Encounter: Payer: Self-pay | Admitting: Family Medicine

## 2021-07-04 ENCOUNTER — Other Ambulatory Visit: Payer: Self-pay

## 2021-07-04 ENCOUNTER — Ambulatory Visit: Payer: 59 | Admitting: Family Medicine

## 2021-07-04 VITALS — BP 106/60 | HR 62 | Temp 98.3°F | Ht 75.0 in | Wt 199.0 lb

## 2021-07-04 DIAGNOSIS — M222X1 Patellofemoral disorders, right knee: Secondary | ICD-10-CM | POA: Diagnosis not present

## 2021-07-04 DIAGNOSIS — M222X2 Patellofemoral disorders, left knee: Secondary | ICD-10-CM

## 2021-07-04 DIAGNOSIS — M7671 Peroneal tendinitis, right leg: Secondary | ICD-10-CM | POA: Diagnosis not present

## 2021-07-04 MED ORDER — MELOXICAM 15 MG PO TABS: 15.0000 mg | ORAL_TABLET | Freq: Every day | ORAL | 0 refills | Status: DC | PRN

## 2021-07-04 NOTE — Progress Notes (Signed)
Primary Care / Sports Medicine Office Visit  Patient Information:  Patient ID: Troy Logan, male DOB: Mar 21, 1986 Age: 35 y.o. MRN: [id number]   Rebecca Motta is a pleasant 35 y.o. male presenting with the following:  Chief Complaint  Patient presents with   Follow-up   Pain in lateral portion of right ankle    Relieved by taking Meloxicam; X-ray 05/10/21; using ice compress as needed; left knee pain and popping for over compensating per patient; 1/10 pain today    Review of Systems pertinent details above   Patient Active Problem List   Diagnosis Date Noted   Bilateral patellofemoral syndrome 07/04/2021   Peroneal tendinitis of right lower extremity 07/04/2021   History reviewed. No pertinent past medical history. Outpatient Encounter Medications as of 07/04/2021  Medication Sig   meloxicam (MOBIC) 15 MG tablet Take 1 tablet (15 mg total) by mouth daily as needed for pain.   [DISCONTINUED] meloxicam (MOBIC) 15 MG tablet Take 1 tablet (15 mg total) by mouth daily.   No facility-administered encounter medications on file as of 07/04/2021.   Past Surgical History:  Procedure Laterality Date   APPENDECTOMY      Vitals:   07/04/21 1321  BP: 106/60  Pulse: 62  Temp: 98.3 F (36.8 C)  SpO2: 98%   Vitals:   07/04/21 1321  Weight: 199 lb (90.3 kg)  Height: 6\' 3"  (1.905 m)   Body mass index is 24.87 kg/m.  No results found.   Independent interpretation of notes and tests performed by another provider:   None  Procedures performed:   None  Pertinent History, Exam, Impression, and Recommendations:   Bilateral patellofemoral syndrome Patient with longstanding bilateral, left greater than right anterior knee pain, mild in nature, recently exacerbated while he was dealing with right ankle issues.  Denies any mechanical symptoms, no swelling, feels that symptoms are worse when he is less active.  Physical examination today reveals full painless range of motion  0-135 degrees, nontender patellar facets, nontender joint lines, negative McMurray's, negative anterior/posterior drawer, negative valgus/varus stressing, there is noted dynamic patellar maltracking during passive flexion/extension.  He does have prominent pes cavus.  His overall presentation and symptoms are consistent with patellofemoral maltracking syndrome bilaterally, risk factors include high level of activity, pes cavus, resolving/resolved right ankle pathology.  I reviewed the same with the patient as well as treatment strategy.  He would benefit from orthotics, we will begin with OTC orthotics, formal physical therapy for the knees and right ankle, and as needed meloxicam as an adjunct to rehab process.  He was advised to contact our office for any lingering symptoms, if noted x-rays of the knees and follow-up advised.  Peroneal tendinitis of right lower extremity Patient has noted resolution of right lateral ankle pain, following the last visit he had brief onset of insertional Achilles discomfort (as described by patient), was able to gradually return to running without recurrence of ankle issues, but did note knee issues as described elsewhere in A&P.  Plan for physical therapy, OTC orthotics, and follow-up as needed.   Orders & Medications Meds ordered this encounter  Medications   meloxicam (MOBIC) 15 MG tablet    Sig: Take 1 tablet (15 mg total) by mouth daily as needed for pain.    Dispense:  30 tablet    Refill:  0   Orders Placed This Encounter  Procedures   Ambulatory referral to Physical Therapy     Return if symptoms worsen or  fail to improve.     Jerrol Banana, MD   Primary Care Sports Medicine [facility]

## 2021-07-04 NOTE — Assessment & Plan Note (Signed)
Patient with longstanding bilateral, left greater than right anterior knee pain, mild in nature, recently exacerbated while he was dealing with right ankle issues.  Denies any mechanical symptoms, no swelling, feels that symptoms are worse when he is less active.  Physical examination today reveals full painless range of motion 0-135 degrees, nontender patellar facets, nontender joint lines, negative McMurray's, negative anterior/posterior drawer, negative valgus/varus stressing, there is noted dynamic patellar maltracking during passive flexion/extension.  He does have prominent pes cavus.  His overall presentation and symptoms are consistent with patellofemoral maltracking syndrome bilaterally, risk factors include high level of activity, pes cavus, resolving/resolved right ankle pathology.  I reviewed the same with the patient as well as treatment strategy.  He would benefit from orthotics, we will begin with OTC orthotics, formal physical therapy for the knees and right ankle, and as needed meloxicam as an adjunct to rehab process.  He was advised to contact our office for any lingering symptoms, if noted x-rays of the knees and follow-up advised.

## 2021-07-04 NOTE — Patient Instructions (Addendum)
-   Start physical therapy with referral provided, they will provide updated home exercises free to perform daily - Recommend over-the-counter (Internet) orthotics (i.e Spenco Total Support Max) - Can dose meloxicam once daily on an as-needed basis - Contact us for any lingering symptoms at the end of physical therapy or for questions - Follow-up as needed

## 2021-07-04 NOTE — Assessment & Plan Note (Addendum)
Patient has noted resolution of right lateral ankle pain, following the last visit he had brief onset of insertional Achilles discomfort (as described by patient), was able to gradually return to running without recurrence of ankle issues, but did note knee issues as described elsewhere in A&P.  Plan for physical therapy, OTC orthotics, and follow-up as needed.

## 2021-07-09 ENCOUNTER — Telehealth: Payer: Self-pay

## 2021-07-09 DIAGNOSIS — M7671 Peroneal tendinitis, right leg: Secondary | ICD-10-CM

## 2021-07-09 DIAGNOSIS — M222X1 Patellofemoral disorders, right knee: Secondary | ICD-10-CM

## 2021-07-09 MED ORDER — MELOXICAM 15 MG PO TABS: 15.0000 mg | ORAL_TABLET | Freq: Every day | ORAL | 0 refills | Status: DC | PRN

## 2021-07-09 NOTE — Addendum Note (Signed)
Addended by: Lennart Pall on: 07/09/2021 08:59 AM   Modules accepted: Orders

## 2021-07-09 NOTE — Telephone Encounter (Signed)
Prescription sent to the pharmacy electronically.  °

## 2021-07-09 NOTE — Telephone Encounter (Signed)
Patient is requesting a 90 day supply of meloxicam 15 mg instead of 30 day supply.  Please advise.

## 2021-07-20 IMAGING — US US ABDOMEN LIMITED
1 series · 14 of 25 positions shown · non-contrast
Comparison: None.

CLINICAL DATA: Right upper quadrant abdominal pain

EXAM:
ULTRASOUND ABDOMEN LIMITED RIGHT UPPER QUADRANT

[Series 1: us abdomen limited · 0.17mm/px · 14 of 44 slices shown]
[im 1/44]
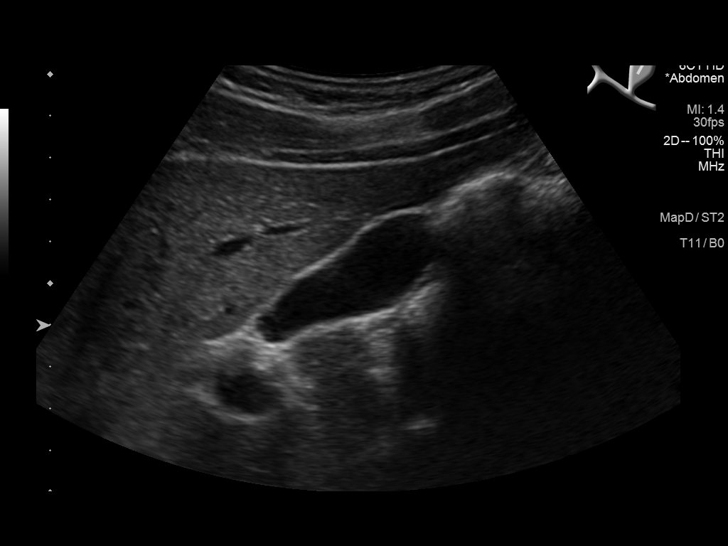
[im 4/44]
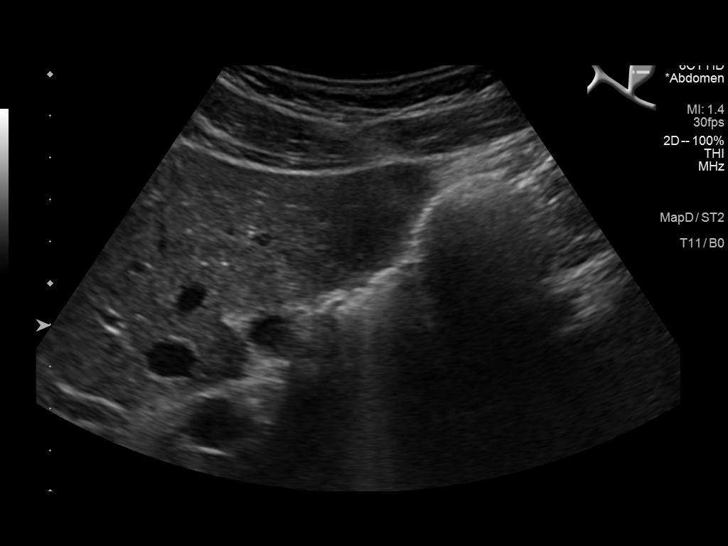
[im 8/44]
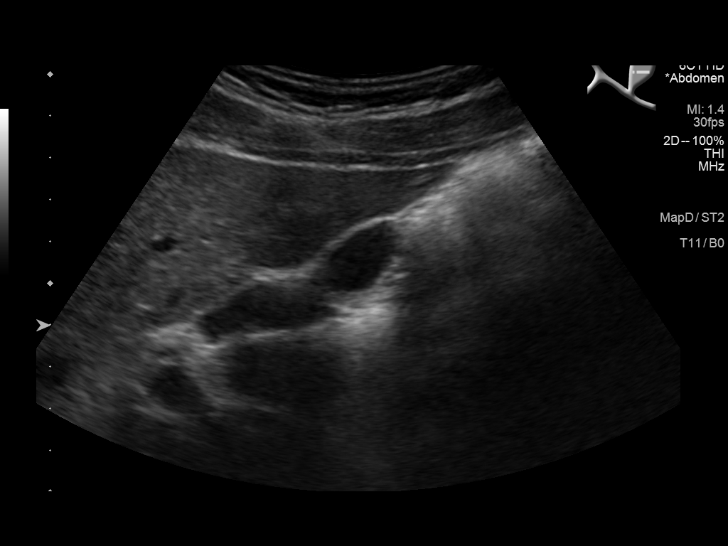
[im 11/44]
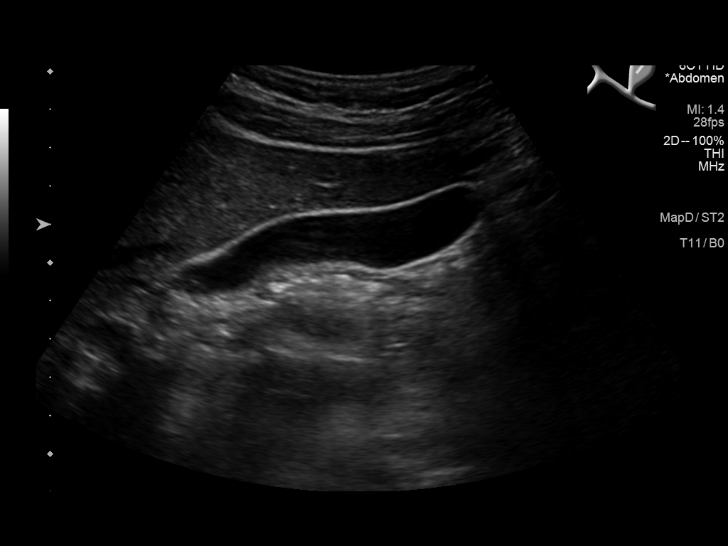
[im 15/44]
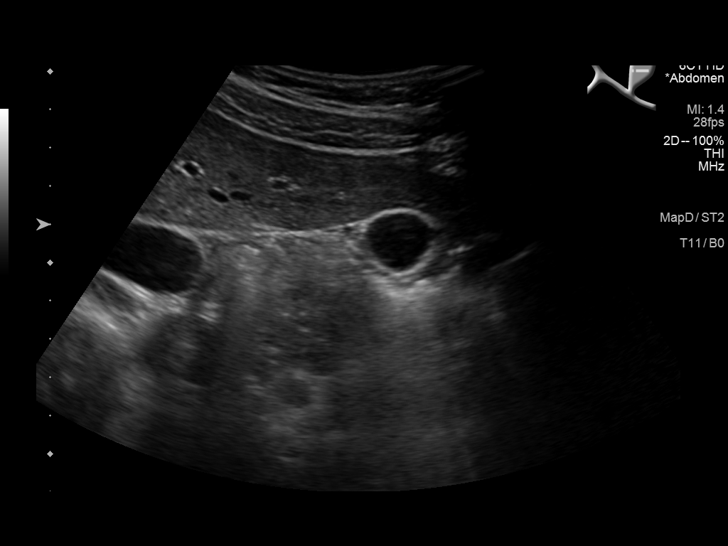
[im 17/44]
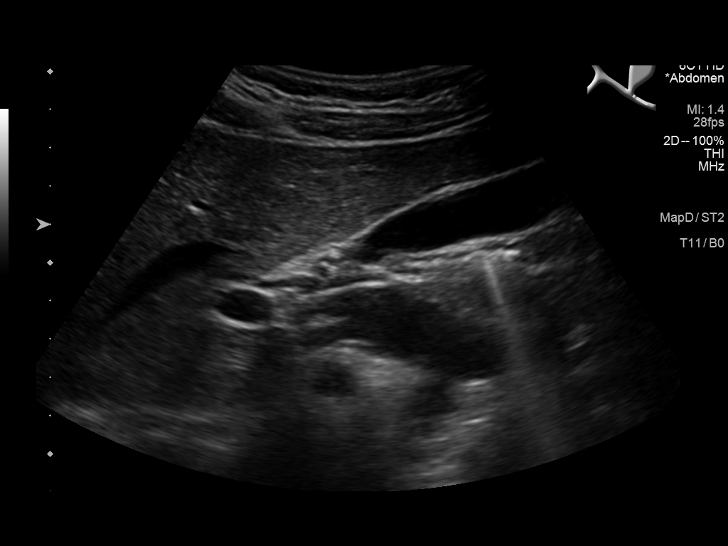
[im 20/44]
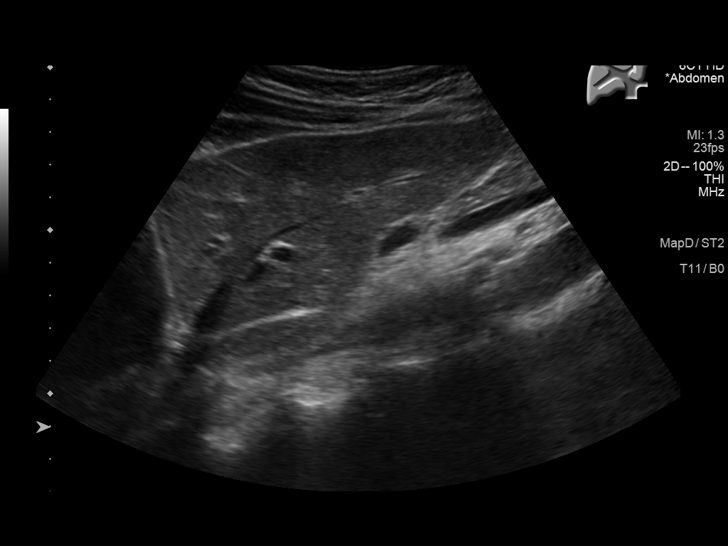
[im 24/44]
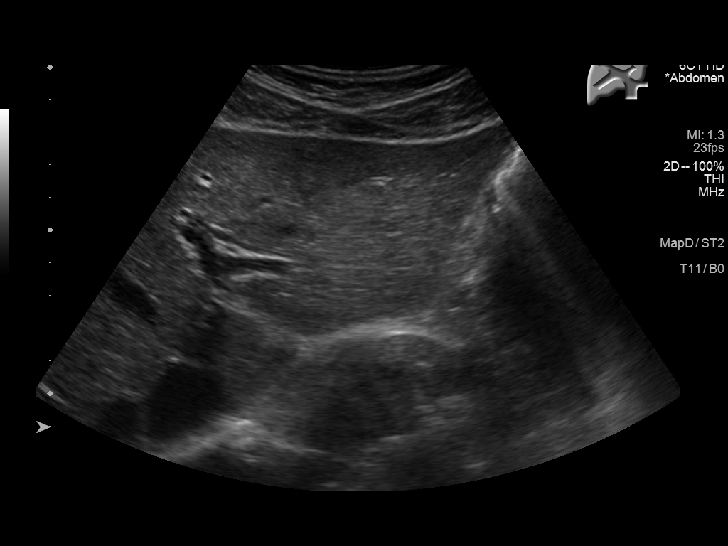
[im 27/44]
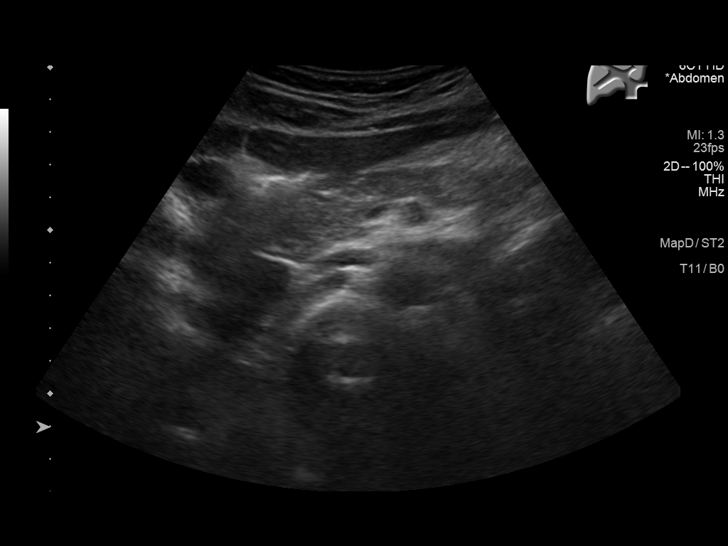
[im 29/44]
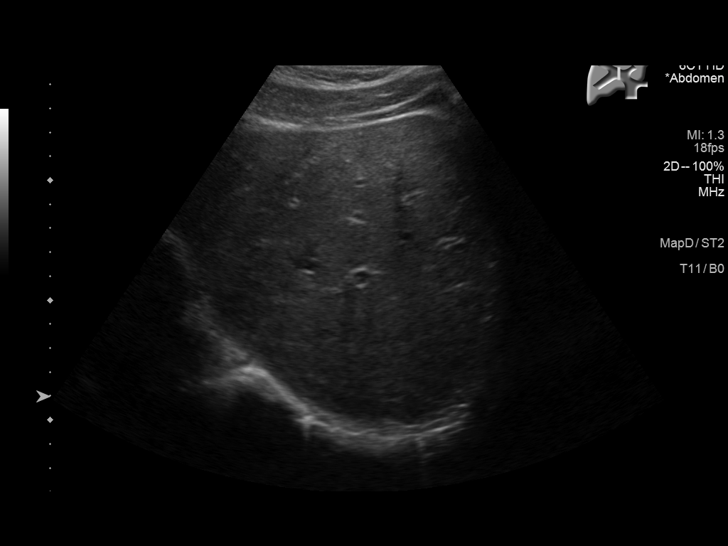
[im 33/44]
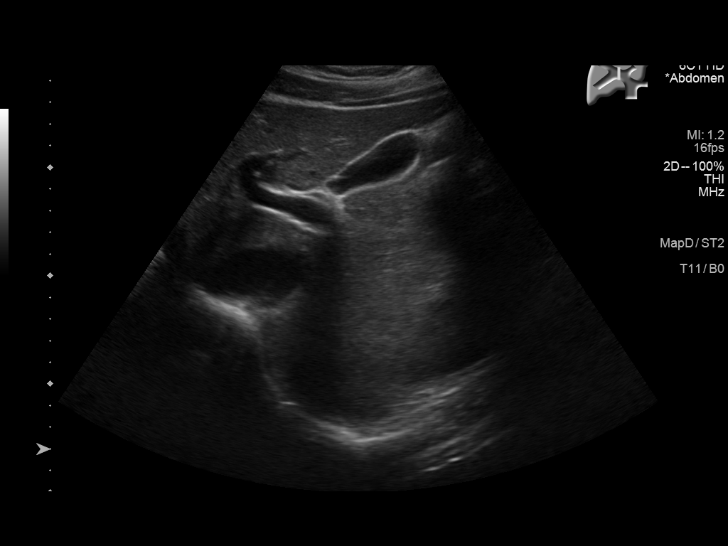
[im 36/44]
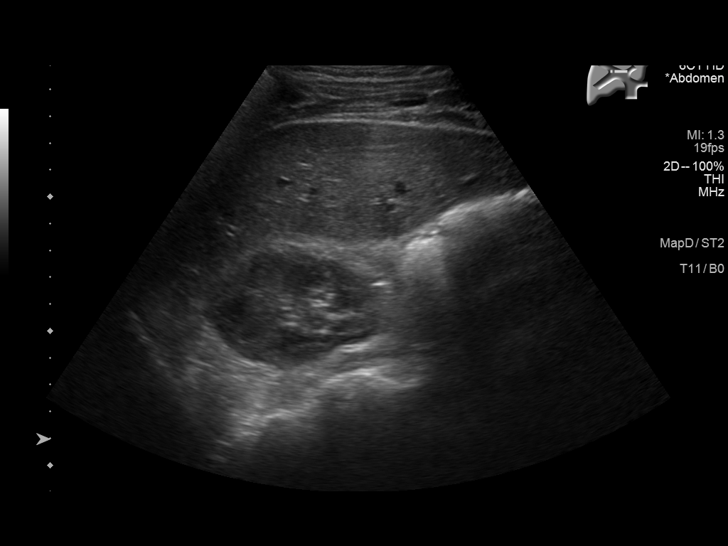
[im 40/44]
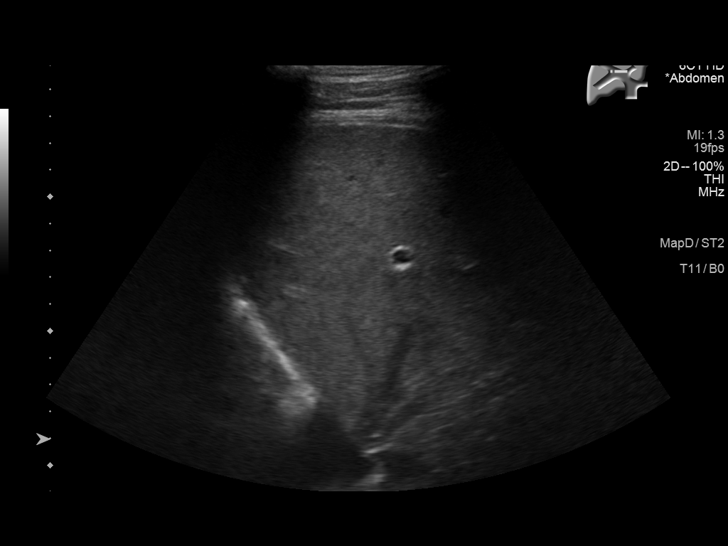
[im 44/44]
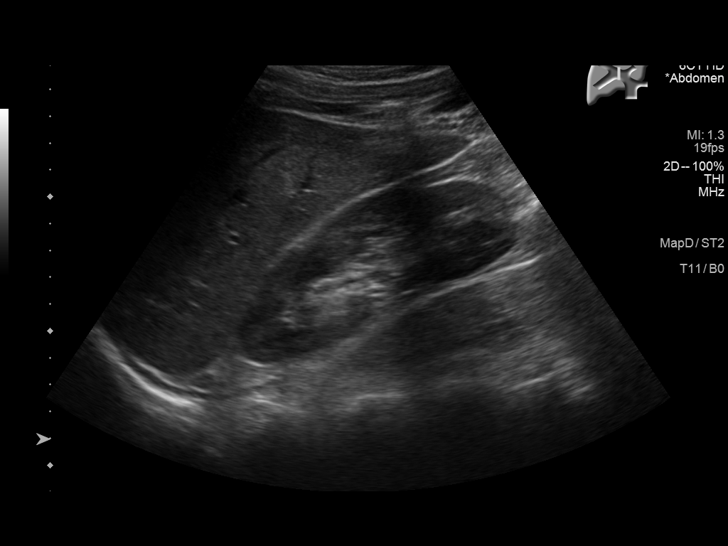

[14 of 25 positions shown; findings below may reference images not displayed]

FINDINGS: Gallbladder:

No gallstones or wall thickening visualized. No sonographic Murphy
sign noted by sonographer.

Common bile duct:

Diameter: 4.8 mm

Liver:

No focal lesion identified. Within normal limits in parenchymal
echogenicity. Portal vein is patent on color Doppler imaging with
normal direction of blood flow towards the liver.

Other: None.
IMPRESSION: Normal examination

## 2021-11-25 ENCOUNTER — Other Ambulatory Visit: Payer: Self-pay | Admitting: Family Medicine

## 2021-11-25 DIAGNOSIS — M7671 Peroneal tendinitis, right leg: Secondary | ICD-10-CM

## 2021-11-25 DIAGNOSIS — M222X2 Patellofemoral disorders, left knee: Secondary | ICD-10-CM

## 2021-11-25 DIAGNOSIS — M222X1 Patellofemoral disorders, right knee: Secondary | ICD-10-CM

## 2021-11-26 MED ORDER — MELOXICAM 15 MG PO TABS: 15.0000 mg | ORAL_TABLET | Freq: Every day | ORAL | 0 refills | Status: DC | PRN

## 2022-01-02 ENCOUNTER — Other Ambulatory Visit: Payer: Self-pay

## 2022-01-02 DIAGNOSIS — M7671 Peroneal tendinitis, right leg: Secondary | ICD-10-CM

## 2022-01-02 DIAGNOSIS — M222X1 Patellofemoral disorders, right knee: Secondary | ICD-10-CM

## 2022-01-06 ENCOUNTER — Ambulatory Visit: Payer: 59 | Admitting: Physical Therapy

## 2022-01-08 ENCOUNTER — Ambulatory Visit: Payer: BC Managed Care – PPO | Attending: Family Medicine | Admitting: Physical Therapy

## 2022-01-08 ENCOUNTER — Other Ambulatory Visit: Payer: Self-pay

## 2022-01-08 DIAGNOSIS — M7671 Peroneal tendinitis, right leg: Secondary | ICD-10-CM | POA: Diagnosis not present

## 2022-01-08 DIAGNOSIS — M222X2 Patellofemoral disorders, left knee: Secondary | ICD-10-CM | POA: Diagnosis not present

## 2022-01-08 DIAGNOSIS — M222X1 Patellofemoral disorders, right knee: Secondary | ICD-10-CM | POA: Insufficient documentation

## 2022-01-08 DIAGNOSIS — M6281 Muscle weakness (generalized): Secondary | ICD-10-CM | POA: Insufficient documentation

## 2022-01-08 DIAGNOSIS — M25562 Pain in left knee: Secondary | ICD-10-CM | POA: Diagnosis not present

## 2022-01-08 DIAGNOSIS — M25571 Pain in right ankle and joints of right foot: Secondary | ICD-10-CM | POA: Insufficient documentation

## 2022-01-08 DIAGNOSIS — M25561 Pain in right knee: Secondary | ICD-10-CM | POA: Insufficient documentation

## 2022-01-08 NOTE — Therapy (Addendum)
[facility] [address] [place], [place], [address] Phone: [phone]   Fax:  [phone]  Physical Therapy Evaluation  Patient Details  Name: Troy Logan MRN: [id number] Date of Birth: [birth year] Referring Provider (PT): Jerrol BananaJason J Matthews, MD   Encounter Date: 01/08/2022   PT End of Session - 01/10/22 1332     Visit Number 1    Number of Visits 7    Date for PT Re-Evaluation 02/04/22    Authorization Type BCBS    Progress Note Due on Visit 7   or final PT visit; [place] 02/05/22   PT Start Time 1504    PT Stop Time 1548    PT Time Calculation (min) 44 min    Activity Tolerance Patient tolerated treatment well;No increased pain    Behavior During Therapy [facility] for tasks assessed/performed              History reviewed. No pertinent past medical history.  Past Surgical History:  Procedure Laterality Date   APPENDECTOMY      There were no vitals filed for this visit.    Subjective Assessment - 01/10/22 1343     Subjective Patient is a 36 year old male with primary complaint of R ankle pain (intermittent lateral ankle pain on opposite side) with history of intermittent bilateral knee pain.    Pertinent History Patient is a 36 year old male with primary complaint of R ankle pain (intermittent lateral ankle pain on opposite side) with history of intermittent bilateral knee pain. 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 He reports pain then began to hurt more along R lateral ankle following this. Patient reports he had subsequent knee pain that has since resolved. He reports having occurrence of lateral ankle pain on his opposite side (left) that has since resolved. Patient reports he had subsequent knee pain that has since resolved. Patient  denies specific region of pain at arrival to PT today. He reports feeling "funny" on his L lateral ankle. Patient reports seldom experiencing numbness/tingling. Patient reports he used to run up to 8-10 kilometers, but has refrained from running recently.    Limitations --   Running, high-impact activities   Diagnostic tests Negative radiograph for R ankle    Patient Stated Goals return to running regimen    Currently in Pain? Yes    Pain Score 2     Aggravating Factors  running, inadvertently inverting foot during weightbearing/running    Pain Relieving Factors rest, ice, Meloxicam              [facility] PT Assessment - 01/10/22 1408       Assessment   Medical Diagnosis Bilateral patellofemoral syndrome, peroneal tendonitis of right lower extremity    Referring Provider (PT) Jerrol BananaJason J Matthews, MD    Onset Date/Surgical Date 06/10/21    Next MD Visit Not scheduled    Prior Therapy None for present condition      Balance Screen   Has the patient fallen in the past 6 months No      Prior Function   Level of Independence Independent      Cognition   Overall Cognitive Status Within Functional Limits for tasks assessed              SUBJECTIVE Chief complaint: Patient is a  36 year old male with primary complaint of R ankle pain (intermittent lateral ankle pain on opposite side) with history of intermittent bilateral knee pain.   History:  Patient is a 36 year old male with primary complaint of R ankle pain (intermittent lateral ankle pain on opposite side) with history of intermittent bilateral knee pain. 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 He reports pain then began to hurt more along R lateral ankle following this. Patient reports he had subsequent knee pain that has since resolved. He reports having occurrence  of lateral ankle pain on his opposite side (left) that has since resolved. Patient reports he had subsequent knee pain that has since resolved. Patient denies specific region of pain at arrival to PT today. He reports feeling "funny" on his L lateral ankle. Patient reports seldom experiencing numbness/tingling. Patient reports he used to run up to 8-10 kilometers, but has refrained from running recently.  Referring Dx: Bilateral patellofemoral syndrome, peroneal tendonitis of right lower extremity Referring Provider: Jerrol Banana, MD Pain location: Lateral ankles; primarily R with fleeting episode of L lateral ankle pain Pain: Present 1-2/10, Best 0/10, Worst 7/10 Pain quality: unable to determine Numbness/Tingling: Yes, intermittently into lateral foot (seldom experiencing this)  Aggravating factors: running, inadvertently inverting foot during weightbearing/running Easing factors: rest, ice, Meloxicam History of back, hip, or knee pain: Yes, Hx of knee pain about 10 years ago  Next MD Visit:  Follow-up appointment with MD: Not scheduled  Imaging: Yes , Negative radiographs for R ankle Occupational demands: pt works from home, computer work primarily [employer]: running, 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 No ecchymosis, erythema, or edema noted. Pes cavus of bilateral ankles.   Movement Screen Bilateral LE Squat: unremarkable Single-limb squat: mild dynamic varus/valgus with  L>R single-limb squat  Unipedal stance: inadvertent rolling into pronation on LLE>RLE  (Above movements repeated  with shoes off) Increased dynamic valgus with single-limb squat with shoes removed on either lower limb   Posture Pes cavus bilateral ankles  Gait Pt maintains high arch with full bilateral and unilateral weightbearing on either leg   Palpation No pain to palpation along medial and lateral malleoli. No pain over anterior or posterior ankle. No tenderness along Achilles tendon or peroneal tendon on either side. Achilles tendon is intact without palpable defect. No tenderness to palpation along anterior, medial, or lateral knee complex presently.  Strength R/L 5/5 Hip flexion 5/5 Hip external rotation 5/5 Hip internal rotation 5/5 Hip extension  5/5 Hip abduction 5/5 Knee extension 5/5 Knee flexion 5/5 Ankle Plantarflexion 5/5 Ankle Dorsiflexion 5/4+ Ankle Inversion 5/5 Ankle Eversion Full ROM for   AROM R/L 50/50 Ankle Plantarflexion 10/10 Ankle Dorsiflexion 35/35 Ankle Inversion 25/25 Ankle Eversion *Indicates Pain  Pt has full active knee flexion and extension bilaterally  PROM No pain with knee flexion or extension overpressure   Muscle Length Gastrocnemius: R Fair, L Fair Ely's: R Positive, L Positive   NEUROLOGICAL:  Mental Status Patient is oriented to person, place and time.  Recent memory is  intact.  Remote memory is intact.  Attention span and concentration are intact.  Expressive speech is intact.  Patient's fund of knowledge is within normal limits for educational level.   SPECIAL TESTS  Ligamentous Integrity Inversion Stress Test: Negative External Rotation Test (High ankle, dorsiflexion and external rotation): Negative Squeeze Test (High ankle): Negative Impingment Sign (Dorsiflexion and eversion): Negative  Achilles Integrity Thompson Test: Not done  Pronation/Supination Navicular Drop: Negative  Nerve Test Tarsal Tunnel Test (maximal DF, EV, toe ext with tapping over tarsal tunnel): Not done Test for Morton's Neuroma (compress  metatarsals and mobilize): Negative   ASSESSMENT Pt is a pleasant 36 year old male with referring diagnoses of bilateral PFPS and R peroneal tendonitis. Patient is avid runner who was previously training for Iron[employer] triathlon. Patient's symptoms are intermittent and mainly affected by running/training at this time - Hx of knee pain that has resolved and recent intermittent occurrences of lateral ankle pain that has affected either side. PT examination reveals deficits in motor control fault with single-limb loading and lowering, mild ankle inversion strength deficit, intermittent R>L lateral ankle pain and anterior knee pain associated with higher volumes of running > 2-3 km.. Pt will benefit from PT services to address deficits in motor control, LE kinetic chain stability and proprioception, and intermittent R>L ankle and bilat knee pain in order to return to full function and running regimen with graded return to activity.      PT Short Term Goals - 01/10/22 1356       PT SHORT TERM GOAL #1   Title Pt will be independent with HEP in order to decrease lower limb pain and increase strength/motor control in order to improve pain-free function at home and work.    Baseline 01/08/22: HEP to be initiated next visit    Time 2    Period Weeks    Status New    Target Date 01/23/22               PT Long Term Goals - 01/10/22 1357       PT LONG TERM GOAL #1   Title Patient will demonstrate improved function as evidenced by increase to predicted score on FOTO measure for full participation in activities at home and in the community.    Baseline 01/08/22: FOTO completed for knee, will complete FOTO for ankle next visit.    Time 4    Period Weeks    Status New    Target Date 02/05/22      PT LONG TERM GOAL #2   Title Pt will decrease worst pain as reported on NPRS by at least 3 points in order to demonstrate clinically significant reduction in ankle/foot pain.    Baseline 01/08/22: Pain 7/10 at  worst    Time 4    Period Weeks    Status New    Target Date 02/05/22      PT LONG TERM GOAL #3   Title Patient will perform single-limb squat without excessive rolling of ankle or dynamic valgus on either side indicate of improved LE kinetic chain motor control    Baseline 01/08/22: Ankle rolling into external rotation/pronation and dynamic valgus with single limb squatting    Time 4    Period Weeks    Status New    Target Date 02/05/22      PT LONG TERM GOAL #4   Title Patient will iniitate graded return to running program with progression to 20-minute run with 3-minute run/1-minute walk ratio without  reproduction of lower limb pain as needed for return to high-volume running required for routine running exercise and training for Ironman    Baseline 01/08/22: Difficulty with running > 2-3 km    Time 4    Period Weeks    Status New    Target Date 02/05/22                    Plan - 01/10/22 1352     Clinical Impression Statement Pt is a pleasant 36 year old male with referring diagnoses of bilateral PFPS and R peroneal tendonitis. Patient is avid runner who was previously training for [employer] triathlon. Patient's symptoms are intermittent and mainly affected by running/training at this time - Hx of knee pain that has resolved and recent intermittent occurrences of lateral ankle pain that has affected either side. PT examination reveals deficits in motor control fault with single-limb loading and lowering, mild ankle inversion strength deficit, intermittent R>L lateral ankle pain and anterior knee pain associated with higher volumes of running > 2-3 km.. Pt will benefit from PT services to address deficits in motor control, LE kinetic chain stability and proprioception, and intermittent R>L ankle and bilat knee pain in order to return to full function and running regimen with graded return to activity.    Personal Factors and Comorbidities Past/Current Experience;Time since onset of  injury/illness/exacerbation    Examination-Activity Limitations --   running, iron man training, high-impact activities   Examination-Participation Restrictions --   Iron[employer] triathlon   Stability/Clinical Decision Making Evolving/Moderate complexity    Clinical Decision Making Moderate    Rehab Potential Good    PT Frequency --   1-2x/week   PT Duration 4 weeks    PT Treatment/Interventions ADLs/Self Care Home Management;Cryotherapy;Moist Heat;Therapeutic activities;Therapeutic exercise;Neuromuscular re-education;Patient/family education;Manual techniques    PT Next Visit Plan Complete FOTO for knee. Treadmill running assessment, progression of proprioceptive drills and motor control re-training with education for modification to running volume with anticipated progression to graded return to running program.    PT Home Exercise Plan Formal HEP to be established next visit             Patient will benefit from skilled therapeutic intervention in order to improve the following deficits and impairments:  Pain, Decreased balance, Decreased strength  Visit Diagnosis: Pain in right ankle and joints of right foot - Plan: PT plan of care cert/re-cert  Pain in both knees, unspecified chronicity - Plan: PT plan of care cert/re-cert  Muscle weakness (generalized) - Plan: PT plan of care cert/re-cert     Problem List Patient Active Problem List   Diagnosis Date Noted   Bilateral patellofemoral syndrome 07/04/2021   Peroneal tendinitis of right lower extremity 07/04/2021   *Addendum to correct information in subjective*  Consuela Mimes, PT, DPT #[id number]  Gertie Exon, PT 01/10/2022, 2:13 PM  [place] [facility] [address] [place], [place], [address] Phone: [phone]   Fax:  [phone]  Name: Troy Logan MRN: [id number] Date of Birth: Nov 19, 1985

## 2022-01-10 ENCOUNTER — Encounter: Payer: Self-pay | Admitting: Physical Therapy

## 2022-01-13 ENCOUNTER — Other Ambulatory Visit: Payer: Self-pay

## 2022-01-13 ENCOUNTER — Ambulatory Visit: Payer: BC Managed Care – PPO | Admitting: Physical Therapy

## 2022-01-13 ENCOUNTER — Encounter: Payer: Self-pay | Admitting: Physical Therapy

## 2022-01-13 DIAGNOSIS — M6281 Muscle weakness (generalized): Secondary | ICD-10-CM | POA: Diagnosis not present

## 2022-01-13 DIAGNOSIS — M25561 Pain in right knee: Secondary | ICD-10-CM | POA: Diagnosis not present

## 2022-01-13 DIAGNOSIS — M25571 Pain in right ankle and joints of right foot: Secondary | ICD-10-CM | POA: Diagnosis not present

## 2022-01-13 DIAGNOSIS — M222X2 Patellofemoral disorders, left knee: Secondary | ICD-10-CM | POA: Diagnosis not present

## 2022-01-13 DIAGNOSIS — M7671 Peroneal tendinitis, right leg: Secondary | ICD-10-CM | POA: Diagnosis not present

## 2022-01-13 DIAGNOSIS — M25562 Pain in left knee: Secondary | ICD-10-CM | POA: Diagnosis not present

## 2022-01-13 DIAGNOSIS — M222X1 Patellofemoral disorders, right knee: Secondary | ICD-10-CM | POA: Diagnosis not present

## 2022-01-13 NOTE — Therapy (Signed)
[facility] [address] [place], [place], [address] Phone: [phone]   Fax:  [phone]  Physical Therapy Treatment  Patient Details  Name: Troy Logan MRN: [id number] Date of Birth: 06-25-1986 Referring Provider (PT): Jerrol Banana, MD   Encounter Date: 01/13/2022   PT End of Session - 01/13/22 1516     Visit Number 2    Number of Visits 7    Date for PT Re-Evaluation 02/04/22    Authorization Type BCBS    Progress Note Due on Visit 7   or final PT visit; [place] 02/05/22   PT Start Time 1502    PT Stop Time 1545    PT Time Calculation (min) 43 min    Activity Tolerance Patient tolerated treatment well;No increased pain    Behavior During Therapy [facility] for tasks assessed/performed             History reviewed. No pertinent past medical history.  Past Surgical History:  Procedure Laterality Date   APPENDECTOMY      There were no vitals filed for this visit.   Subjective Assessment - 01/13/22 1513     Subjective Patient reports tolerating one bout of running 2 mi fairly well at the time. He reports episode of discomfort along mid-substance to insertional Achilles on his L side that impoved with successive running (worse in the beginning). He reports performing Alfredson protocol exercises and he feels that this has helped historically. Patient reports otherwise feeling relatively well today and denies notable pain at arrival to physical therapy.    Pertinent History Patient is a 36 year old male with primary complaint of R ankle pain (intermittent lateral ankle pain on opposite side) with history of intermittent bilateral knee pain. 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 He reports pain then began to hurt more along R lateral ankle  following this. Patient reports he had subsequent knee pain that has since resolved. He reports having occurrence of lateral ankle pain on his opposite side (left) that has since resolved. Patient reports he had subsequent knee pain that has since resolved. Patient denies specific region of pain at arrival to PT today. He reports feeling "funny" on his L lateral ankle. Patient reports seldom experiencing numbness/tingling. Patient reports he used to run up to 8-10 kilometers, but has refrained from running recently.    Limitations --   Running, high-impact activities   Diagnostic tests Negative radiograph for R ankle    Patient Stated Goals return to running regimen               FOTO (ankle): 68/81    TREATMENT  Treadmill running assessment: high impact mid-foot striking pattern with apparently dec force attenuation, upright posture with relaxed upper limbs; no remarkable frontal plane asymmetry. Pt exhibits mid-foot striking pattern with shortened stride length relative to limb length (increased cadence versus stride for running velocity)    Neuromuscular Re-education - exercises to promote LE kinetic chain stability, proprioceptive re-training and drills to improve motor control c single-limb loading  Lateral stepdown; 2x10; 6-inch step with bilateral LE with mirror feedback and verbal cueing/demo to improve frontal plane control SLS with [employer]irex; 2x30 seconds, bilateral  [place] split squat; 2x10, bilateral LE - with focus on limiting knee valgus and slow/controlled eccentric phase  *next visit*  Single-limb RDL    Therapeutic Exercise - for improved soft tissue flexibility and extensibility as needed for ROM, improved strength as needed to improve performance of [facility] activities/functional movements   Treadmill assessment (see above), 8 minutes with 6.0 mph run, 3 min cooldown at 2.8 mph walk  [place] split squat plyometric; 2x10, bilateral LE for re-training force  attenuation with single-limb landing  Pt edu: Discussed continued running at pain-free low volume and continued use of Alfredson protocol exercises at home. Updated HEP and provided handout to patient (see [employer] Access Code)    PT Short Term Goals - 01/10/22 1356       PT SHORT TERM GOAL #1   Title Pt will be independent with HEP in order to decrease lower limb pain and increase strength/motor control in order to improve pain-free function at home and work.    Baseline 01/08/22: HEP to be initiated next visit    Time 2    Period Weeks    Status New    Target Date 01/23/22               PT Long Term Goals - 01/13/22 1517       PT LONG TERM GOAL #1   Title Patient will demonstrate improved function as evidenced by increase to 81 score on FOTO measure for full participation in activities at home and in the community.    Baseline 01/08/22: FOTO completed for knee, will complete FOTO for ankle next visit.  01/13/22: FOTO 68    Time 4    Period Weeks    Status New    Target Date 02/05/22      PT LONG TERM GOAL #2   Title Pt will decrease worst pain as reported on NPRS by at least 3 points in order to demonstrate clinically significant reduction in ankle/foot pain.    Baseline 01/08/22: Pain 7/10 at worst    Time 4    Period Weeks    Status New    Target Date 02/05/22      PT LONG TERM GOAL #3   Title Patient will perform single-limb squat without excessive rolling of ankle or dynamic valgus on either side indicate of improved LE kinetic chain motor control    Baseline 01/08/22: Ankle rolling into external rotation/pronation and dynamic valgus with single limb squatting    Time 4    Period Weeks    Status New    Target Date 02/05/22      PT LONG TERM GOAL #4   Title Patient will iniitate graded return to running program with progression to 20-minute run with 3-minute run/1-minute walk ratio without reproduction of lower limb pain as needed for return to high-volume running  required for routine running exercise and training for Iron[employer]    Baseline 01/08/22: Difficulty with running > 2-3 km    Time 4    Period Weeks    Status New    Target Date 02/05/22                   Plan - 01/14/22 2154     Clinical Impression Statement Patient has fleeting pain affecting L Achilles tendon with recent bout of running 2 miles (low volume relative to his typical regimen). He has responded well with Alfredson protocol for Achilles tendon eccentric loading and is appropriate for continuing this program at home along with given home exercises today. Patient demonstrates fair running mechanics with dec force attenuation at loading response - mid  to forefoot striking pattern is appropriate given history of Achilles tendon-related pain. Progressed program for additional proprioceptive re-training, motor control work, and moderate plyometrics with updated HEP. Patient will benefit from continued skilled PT intervention for best return to PLOF and return to desired running exercise regimen.    Personal Factors and Comorbidities Past/Current Experience;Time since onset of injury/illness/exacerbation    Examination-Activity Limitations --   running, iron man training, high-impact activities   Examination-Participation Restrictions --   Ironman triathlon   Stability/Clinical Decision Making Evolving/Moderate complexity    Rehab Potential Good    PT Frequency --   1-2x/week   PT Duration 4 weeks    PT Treatment/Interventions ADLs/Self Care Home Management;Cryotherapy;Moist Heat;Therapeutic activities;Therapeutic exercise;Neuromuscular re-education;Patient/family education;Manual techniques    PT Next Visit Plan Progression of proprioceptive drills and motor control re-training with education for modification to running volume with anticipated progression to graded return to running program. Plyometric work to improve single-limb landing mechanics and ankle stabilization    PT Home  Exercise Plan Access Code [email or web address]             Patient will benefit from skilled therapeutic intervention in order to improve the following deficits and impairments:  Pain, Decreased balance, Decreased strength  Visit Diagnosis: Pain in right ankle and joints of right foot  Pain in both knees, unspecified chronicity  Muscle weakness (generalized)     Problem List Patient Active Problem List   Diagnosis Date Noted   Bilateral patellofemoral syndrome 07/04/2021   Peroneal tendinitis of right lower extremity 07/04/2021   Consuela Mimes, PT, DPT #[id number]  Gertie Exon, PT 01/14/2022, 9:58 PM  [place] [facility] [address] [place], [place], [address] Phone: [phone]   Fax:  [phone]  Name: Zakarias Shirazi MRN: [id number] Date of Birth: 1985-12-11

## 2022-01-15 ENCOUNTER — Ambulatory Visit (INDEPENDENT_AMBULATORY_CARE_PROVIDER_SITE_OTHER): Payer: BC Managed Care – PPO | Admitting: Family Medicine

## 2022-01-15 ENCOUNTER — Encounter: Payer: Self-pay | Admitting: Family Medicine

## 2022-01-15 ENCOUNTER — Other Ambulatory Visit: Payer: Self-pay

## 2022-01-15 ENCOUNTER — Ambulatory Visit: Payer: BC Managed Care – PPO | Admitting: Physical Therapy

## 2022-01-15 VITALS — BP 120/80 | HR 60 | Ht 75.0 in | Wt 207.0 lb

## 2022-01-15 DIAGNOSIS — M222X1 Patellofemoral disorders, right knee: Secondary | ICD-10-CM | POA: Diagnosis not present

## 2022-01-15 DIAGNOSIS — M6281 Muscle weakness (generalized): Secondary | ICD-10-CM

## 2022-01-15 DIAGNOSIS — Z0000 Encounter for general adult medical examination without abnormal findings: Secondary | ICD-10-CM

## 2022-01-15 DIAGNOSIS — M25562 Pain in left knee: Secondary | ICD-10-CM | POA: Diagnosis not present

## 2022-01-15 DIAGNOSIS — M25561 Pain in right knee: Secondary | ICD-10-CM

## 2022-01-15 DIAGNOSIS — M25571 Pain in right ankle and joints of right foot: Secondary | ICD-10-CM

## 2022-01-15 DIAGNOSIS — M7671 Peroneal tendinitis, right leg: Secondary | ICD-10-CM | POA: Diagnosis not present

## 2022-01-15 DIAGNOSIS — M222X2 Patellofemoral disorders, left knee: Secondary | ICD-10-CM | POA: Diagnosis not present

## 2022-01-15 LAB — HEMOCCULT GUIAC POC 1CARD (OFFICE): Fecal Occult Blood, POC: NEGATIVE

## 2022-01-15 NOTE — Therapy (Signed)
[facility] [address] [place], [place], [address] Phone: [phone]   Fax:  [phone]  Physical Therapy Treatment  Patient Details  Name: Troy Logan MRN: [id number] Date of Birth: 03-Feb-1986 Referring Provider (PT): Jerrol Banana, MD   Encounter Date: 01/15/2022   PT End of Session - 01/18/22 1505     Visit Number 3    Number of Visits 7    Date for PT Re-Evaluation 02/04/22    Authorization Type BC[employer]    Progress Note Due on Visit 7   or final PT visit; [place] 02[place]05[place]22   PT Start Time 1500    PT Stop Time 1544    PT Time Calculation (min) 44 min    Activity Tolerance Patient tolerated treatment well;No increased pain    Behavior During Therapy [facility] for tasks assessed/performed              History reviewed. No pertinent past medical history.  Past Surgical History:  Procedure Laterality Date   APPENDECTOMY      There were no vitals filed for this visit.   Subjective Assessment - 01/18/22 1505     Subjective Patient reports completing 2-mile run yesterday and feeling good with this. Patient reports doing well with initial home exercises given on Monday. Patient reports no notable pain at arrival to PT.    Pertinent History Patient is a 36 year old male with primary complaint of R ankle pain (intermittent lateral ankle pain on opposite side) with history of intermittent bilateral knee pain. 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 He reports pain then began to hurt more along R lateral ankle following this. Patient reports he had subsequent knee pain that has since resolved. He reports having occurrence of lateral ankle pain on his opposite side (left) that has since resolved. Patient reports he had subsequent knee pain that has  since resolved. Patient denies specific region of pain at arrival to PT today. He reports feeling "funny" on his L lateral ankle. Patient reports seldom experiencing numbness/tingling. Patient reports he used to run up to 8-10 kilometers, but has refrained from running recently.    Limitations --   Running, high-impact activities   Diagnostic tests Negative radiograph for R ankle    Patient Stated Goals return to running regimen               TREATMENT       Neuromuscular Re-education - exercises to promote LE kinetic chain stability, proprioceptive re-training and drills to improve motor control c single-limb loading   Lateral stepdown; 1x10; 6-inch step with bilateral LE with mirror feedback and verbal cueing/demo to improve frontal plane control - reviewed for technique and cueing for dynamic valgus movement fault   SLS with ball toss to plyoboard; 6-lb weight; x20 bilat LE  Reverse lunge - with Green Tband pulling into valgus to increase demand of controlling for dynamic valgus; x20 bilateral LE BOSU squat, in // bars for safe stepping on/off; 2x8 with slow eccentric   *not today* [place] split squat; 2x10, bilateral LE         Therapeutic Exercise - for improved soft tissue flexibility and extensibility as needed for ROM, improved strength as needed to improve performance of CKC activities/functional movements   Seated elliptical; seat at 13; Level  6, 5 minutes - for inc tissue temperature for improved muscle performance; interval subjective gathered during this time   [place] split squat plyometric; 1x10, bilateral LE for re-training force attenuation with single-limb landing  Forward and reverse monster walk; with Blue Tband around thighs; 5x D/B agility ladder   Prone Quad stretch; 3x30sec      *not today* Treadmill assessment (see above), 8 minutes with 6.0 mph run, 3 min cooldown at 2.8 mph walk    Patient presents with improving movement control fault  with stepping down/eccentric lowering. He is able to progress with proprioceptive re-training and stabilization drills without reproduction of knee or ankle pain. Pt has fortunately tolerated low-volume runs (2 miles) well over the last week without reproduction of symptoms. Will continue with low-volume running with plan to gradually transition to graded return to running with conservative progression of duration/volume. Patient will benefit from continued skilled PT intervention for best return to PLOF and return to desired running exercise regimen.      PT Short Term Goals - 01/10/22 1356       PT SHORT TERM GOAL #1   Title Pt will be independent with HEP in order to decrease lower limb pain and increase strength/motor control in order to improve pain-free function at home and work.    Baseline 01/08/22: HEP to be initiated next visit    Time 2    Period Weeks    Status New    Target Date 01/23/22               PT Long Term Goals - 01/13/22 1517       PT LONG TERM GOAL #1   Title Patient will demonstrate improved function as evidenced by increase to 81 score on FOTO measure for full participation in activities at home and in the community.    Baseline 01/08/22: FOTO completed for knee, will complete FOTO for ankle next visit.  01/13/22: FOTO 68    Time 4    Period Weeks    Status New    Target Date 02/05/22      PT LONG TERM GOAL #2   Title Pt will decrease worst pain as reported on NPRS by at least 3 points in order to demonstrate clinically significant reduction in ankle/foot pain.    Baseline 01/08/22: Pain 7/10 at worst    Time 4    Period Weeks    Status New    Target Date 02/05/22      PT LONG TERM GOAL #3   Title Patient will perform single-limb squat without excessive rolling of ankle or dynamic valgus on either side indicate of improved LE kinetic chain motor control    Baseline 01/08/22: Ankle rolling into external rotation/pronation and dynamic valgus with single limb  squatting    Time 4    Period Weeks    Status New    Target Date 02/05/22      PT LONG TERM GOAL #4   Title Patient will iniitate graded return to running program with progression to 20-minute run with 3-minute run/1-minute walk ratio without reproduction of lower limb pain as needed for return to high-volume running required for routine running exercise and training for Ironman    Baseline 01/08/22: Difficulty with running > 2-3 km    Time 4    Period Weeks    Status New    Target Date 02/05/22  Plan - 01/18/22 1509     Clinical Impression Statement Patient presents with improving movement control fault with stepping down/eccentric lowering. He is able to progress with proprioceptive re-training and stabilization drills without reproduction of knee or ankle pain. Pt has fortunately tolerated low-volume runs (2 miles) well over the last week without reproduction of symptoms. Will continue with low-volume running with plan to gradually transition to graded return to running with conservative progression of duration/volume. Patient will benefit from continued skilled PT intervention for best return to PLOF and return to desired running exercise regimen.    Personal Factors and Comorbidities Past/Current Experience;Time since onset of injury/illness/exacerbation    Examination-Activity Limitations --   running, iron man training, high-impact activities   Examination-Participation Restrictions --   Ironman triathlon   Stability/Clinical Decision Making Evolving/Moderate complexity    Rehab Potential Good    PT Frequency --   1-2x/week   PT Duration 4 weeks    PT Treatment/Interventions ADLs/Self Care Home Management;Cryotherapy;Moist Heat;Therapeutic activities;Therapeutic exercise;Neuromuscular re-education;Patient/family education;Manual techniques    PT Next Visit Plan Progression of proprioceptive drills and motor control re-training with education for modification  to running volume with anticipated progression to graded return to running program. Plyometric work to improve single-limb landing mechanics and ankle stabilization    PT Home Exercise Plan Access Code [email or web address]             Patient will benefit from skilled therapeutic intervention in order to improve the following deficits and impairments:  Pain, Decreased balance, Decreased strength  Visit Diagnosis: Pain in right ankle and joints of right foot  Pain in both knees, unspecified chronicity  Muscle weakness (generalized)     Problem List Patient Active Problem List   Diagnosis Date Noted   Bilateral patellofemoral syndrome 07/04/2021   Peroneal tendinitis of right lower extremity 07/04/2021   Consuela MimesJeremy Marley Charlot, PT, DPT #[id number]  Gertie ExonJeremy T Treshaun Carrico, PT 01/18/2022, 3:09 PM  [facility] [address] [place], [place], [id number] Phone: [phone]   Fax:  [phone]  Name: Troy Logan MRN: [id number] Date of Birth: Feb 15, 1986

## 2022-01-15 NOTE — Progress Notes (Signed)
Date:  01/15/2022   Name:  Troy Logan   DOB:  [birth year]   MRN:  [id number]   Chief Complaint: Annual Exam  Patient is a 36 year old male who presents for a comprehensive physical exam. The patient reports the following problems: none. Health maintenance has been reviewed up to date.     Lab Results  Component Value Date   NA 144 12/07/2020   K 4.5 12/07/2020   CO2 25 12/07/2020   GLUCOSE 86 12/07/2020   BUN 9 12/07/2020   CREATININE 0.98 12/07/2020   CALCIUM 9.7 12/07/2020   GFRNONAA 100 12/07/2020   Lab Results  Component Value Date   CHOL 177 12/07/2020   HDL 69 12/07/2020   LDLCALC 99 12/07/2020   TRIG 44 12/07/2020   No results found for: TSH No results found for: HGBA1C Lab Results  Component Value Date   WBC 4.0 12/07/2020   HGB 15.5 12/07/2020   HCT 44.3 12/07/2020   MCV 92 12/07/2020   PLT 254 12/07/2020   Lab Results  Component Value Date   ALT 13 12/07/2020   AST 11 12/07/2020   ALKPHOS 84 12/07/2020   BILITOT 2.1 (H) 12/07/2020   No results found for: 25OHVITD2, 25OHVITD3, VD25OH   Review of Systems  Constitutional:  Negative for chills and fever.  HENT:  Negative for drooling, ear discharge, ear pain and sore throat.   Respiratory:  Negative for cough, shortness of breath and wheezing.   Cardiovascular:  Negative for chest pain, palpitations and leg swelling.  Gastrointestinal:  Negative for abdominal pain, blood in stool, constipation, diarrhea and nausea.  Endocrine: Negative for polydipsia.  Genitourinary:  Negative for dysuria, frequency, hematuria and urgency.  Musculoskeletal:  Negative for back pain, myalgias and neck pain.  Skin:  Negative for rash.  Allergic/Immunologic: Negative for environmental allergies.  Neurological:  Negative for dizziness and headaches.  Hematological:  Does not bruise/bleed easily.  Psychiatric/Behavioral:  Negative for suicidal ideas. The patient is not nervous/anxious.    Patient Active Problem List    Diagnosis Date Noted   Bilateral patellofemoral syndrome 07/04/2021   Peroneal tendinitis of right lower extremity 07/04/2021    No Known Allergies  Past Surgical History:  Procedure Laterality Date   APPENDECTOMY      Social History   Tobacco Use   Smoking status: Never   Smokeless tobacco: Never  Vaping Use   Vaping Use: Never used  Substance Use Topics   Alcohol use: Yes   Drug use: Not Currently     Medication list has been reviewed and updated.  Current Meds  Medication Sig   Cyanocobalamin (VITAMIN B 12 PO) Take by mouth as needed.   meloxicam (MOBIC) 15 MG tablet Take 1 tablet (15 mg total) by mouth daily as needed for pain.    PHQ 2/9 Scores 01/15/2022 07/04/2021 05/28/2021 05/10/2021  PHQ - 2 Score 0 0 0 0  PHQ- 9 Score 0 0 3 0    GAD 7 : Generalized Anxiety Score 01/15/2022 07/04/2021 05/28/2021 05/10/2021  Nervous, Anxious, on Edge 0 1 1 0  Control/stop worrying 0 0 0 0  Worry too much - different things 0 0 0 0  Trouble relaxing 0 1 0 0  Restless 0 0 0 0  Easily annoyed or irritable 0 1 1 0  Afraid - awful might happen 0 0 0 0  Total GAD 7 Score 0 3 2 0  Anxiety Difficulty Not difficult at all Not  difficult at all - -    BP Readings from Last 3 Encounters:  01/15/22 120/80  07/04/21 106/60  05/28/21 122/72    Physical Exam Vitals and nursing note reviewed.  Constitutional:      Appearance: He is well-groomed and normal weight.  HENT:     Head: Normocephalic.     Jaw: There is normal jaw occlusion.     Right Ear: Hearing, tympanic membrane, ear canal and external ear normal.     Left Ear: Hearing, tympanic membrane, ear canal and external ear normal.     Nose: Nose normal.     Right Turbinates: Not enlarged or swollen.     Left Turbinates: Not enlarged.     Mouth/Throat:     Mouth: Mucous membranes are moist.     Dentition: Normal dentition.     Tongue: No lesions.     Palate: No mass.     Pharynx: Oropharynx is clear. Uvula midline. No  pharyngeal swelling, oropharyngeal exudate, posterior oropharyngeal erythema or uvula swelling.  Eyes:     General: Lids are normal. Vision grossly intact. Gaze aligned appropriately. No scleral icterus.       Right eye: No discharge.        Left eye: No discharge.     Extraocular Movements: Extraocular movements intact.     Conjunctiva/sclera: Conjunctivae normal.     Pupils: Pupils are equal, round, and reactive to light.     Funduscopic exam:    Right eye: Red reflex present.        Left eye: Red reflex present. Neck:     Thyroid: No thyromegaly.     Vascular: Normal carotid pulses. No carotid bruit, hepatojugular reflux or JVD.     Trachea: Trachea and phonation normal. No tracheal deviation.     Comments: Mild spasm trapezius Cardiovascular:     Rate and Rhythm: Normal rate and regular rhythm.     Pulses: Normal pulses.     Heart sounds: Normal heart sounds, S1 normal and S2 normal. No murmur heard. No systolic murmur is present.  No diastolic murmur is present.    No friction rub. No gallop. No S3 or S4 sounds.  Pulmonary:     Effort: No respiratory distress.     Breath sounds: Normal breath sounds. No stridor. No decreased breath sounds, wheezing, rhonchi or rales.  Chest:  Breasts:    Right: Normal.     Left: Normal.  Abdominal:     General: Bowel sounds are normal.     Palpations: Abdomen is soft. There is no hepatomegaly, splenomegaly or mass.     Tenderness: There is no abdominal tenderness. There is no guarding or rebound.     Hernia: No hernia is present. There is no hernia in the umbilical area, ventral area, left inguinal area or right inguinal area.  Genitourinary:    Pubic Area: No rash.      Penis: Normal and uncircumcised.      Testes: Normal.        Right: Mass, tenderness, testicular hydrocele or varicocele not present.        Left: Mass, tenderness, testicular hydrocele or varicocele not present.     Epididymis:     Right: Normal.     Left: Normal.   Musculoskeletal:        General: No tenderness. Normal range of motion.     Cervical back: Full passive range of motion without pain, normal range of motion and neck supple. Spasms  present.     Thoracic back: Normal.     Lumbar back: Normal.     Right lower leg: No edema.     Left lower leg: No edema.  Feet:     Right foot:     Skin integrity: Skin integrity normal.     Left foot:     Skin integrity: Skin integrity normal.  Lymphadenopathy:     Head:     Right side of head: No submental, submandibular or tonsillar adenopathy.     Left side of head: No submental, submandibular or tonsillar adenopathy.     Cervical: No cervical adenopathy.     Right cervical: No superficial, deep or posterior cervical adenopathy.    Left cervical: No superficial, deep or posterior cervical adenopathy.     Upper Body:     Right upper body: No supraclavicular or axillary adenopathy.     Left upper body: No supraclavicular or axillary adenopathy.  Skin:    General: Skin is warm.     Capillary Refill: Capillary refill takes less than 2 seconds.     Findings: No rash.     Comments: Seb keratoses  Neurological:     Mental Status: He is alert and oriented to person, place, and time.     Cranial Nerves: Cranial nerves 2-12 are intact. No cranial nerve deficit.     Sensory: Sensation is intact.     Coordination: Romberg sign negative.     Deep Tendon Reflexes: Reflexes are normal and symmetric.  Psychiatric:        Behavior: Behavior is cooperative.    Wt Readings from Last 3 Encounters:  01/15/22 207 lb (93.9 kg)  07/04/21 199 lb (90.3 kg)  05/28/21 202 lb (91.6 kg)    BP 120/80    Pulse 60    Ht 6\' 3"  (1.905 m)    Wt 207 lb (93.9 kg)    SpO2 95%    BMI 25.87 kg/m   Assessment and Plan:  Patient's chart was reviewed for previous encounters most recent lab most recent imaging in [place].Tysean Nitzel is a 36 y.o. male who presents today for his Complete Annual Exam. He feels well. He  reports exercising . He reports he is sleeping well.    1. Annual physical exam Immunizations are reviewed and recommendations provided.   Age appropriate screening tests are discussed. Counseling given for risk factor reduction interventions.  No concerns noted during history of present illness/review of systems/or physical exam.  We will obtain labs including renal panel lipid panel hepatic panel and CBC. - POCT Occult Blood Stool - Lipid Panel With LDL/HDL Ratio - Renal Function Panel - CBC with Differential/Platelet  2. Hyperbilirubinemia Chronic.  Episodic.  Persistent but stable and quantification.  We will recheck hepatic function panel for evaluation of bilirubin and fractionate if necessary. - Hepatic Function Panel (6)

## 2022-01-15 NOTE — Patient Instructions (Signed)

## 2022-01-16 ENCOUNTER — Telehealth: Payer: Self-pay

## 2022-01-16 ENCOUNTER — Encounter: Payer: 59 | Admitting: Family Medicine

## 2022-01-16 ENCOUNTER — Other Ambulatory Visit: Payer: Self-pay

## 2022-01-16 DIAGNOSIS — R17 Unspecified jaundice: Secondary | ICD-10-CM

## 2022-01-16 LAB — CBC WITH DIFFERENTIAL/PLATELET
Basophils Absolute: 0 10*3/uL (ref 0.0–0.2)
Basos: 1 %
EOS (ABSOLUTE): 0.3 10*3/uL (ref 0.0–0.4)
Eos: 6 %
Hematocrit: 41 % (ref 37.5–51.0)
Hemoglobin: 14.5 g/dL (ref 13.0–17.7)
Immature Grans (Abs): 0 10*3/uL (ref 0.0–0.1)
Immature Granulocytes: 0 %
Lymphocytes Absolute: 1.1 10*3/uL (ref 0.7–3.1)
Lymphs: 26 %
MCH: 32.2 pg (ref 26.6–33.0)
MCHC: 35.4 g/dL (ref 31.5–35.7)
MCV: 91 fL (ref 79–97)
Monocytes Absolute: 0.3 10*3/uL (ref 0.1–0.9)
Monocytes: 8 %
Neutrophils Absolute: 2.4 10*3/uL (ref 1.4–7.0)
Neutrophils: 59 %
Platelets: 224 10*3/uL (ref 150–450)
RBC: 4.5 x10E6/uL (ref 4.14–5.80)
RDW: 12.2 % (ref 11.6–15.4)
WBC: 4.1 10*3/uL (ref 3.4–10.8)

## 2022-01-16 LAB — RENAL FUNCTION PANEL
Albumin: 5.1 g/dL — ABNORMAL HIGH (ref 4.0–5.0)
BUN/Creatinine Ratio: 10 (ref 9–20)
BUN: 10 mg/dL (ref 6–20)
CO2: 26 mmol/L (ref 20–29)
Calcium: 9.5 mg/dL (ref 8.7–10.2)
Chloride: 102 mmol/L (ref 96–106)
Creatinine, Ser: 0.97 mg/dL (ref 0.76–1.27)
Glucose: 88 mg/dL (ref 70–99)
Phosphorus: 4.1 mg/dL (ref 2.8–4.1)
Potassium: 4.6 mmol/L (ref 3.5–5.2)
Sodium: 148 mmol/L — ABNORMAL HIGH (ref 134–144)
eGFR: 104 mL/min/{1.73_m2}

## 2022-01-16 LAB — LIPID PANEL WITH LDL/HDL RATIO
Cholesterol, Total: 181 mg/dL (ref 100–199)
HDL: 66 mg/dL
LDL Chol Calc (NIH): 104 mg/dL — ABNORMAL HIGH (ref 0–99)
LDL/HDL Ratio: 1.6 ratio (ref 0.0–3.6)
Triglycerides: 56 mg/dL (ref 0–149)
VLDL Cholesterol Cal: 11 mg/dL (ref 5–40)

## 2022-01-16 LAB — HEPATIC FUNCTION PANEL (6)
ALT: 23 [IU]/L (ref 0–44)
AST: 20 [IU]/L (ref 0–40)
Alkaline Phosphatase: 71 [IU]/L (ref 44–121)
Bilirubin Total: 2.1 mg/dL — ABNORMAL HIGH (ref 0.0–1.2)
Bilirubin, Direct: 0.41 mg/dL — ABNORMAL HIGH (ref 0.00–0.40)

## 2022-01-16 NOTE — Telephone Encounter (Signed)
Called patient no answer phone just rang till it hung up 

## 2022-01-17 ENCOUNTER — Telehealth: Payer: Self-pay

## 2022-01-17 NOTE — Telephone Encounter (Signed)
Patient requesting call back to schedule appt. Available after 11am. ?

## 2022-01-17 NOTE — Telephone Encounter (Signed)
Called patient phone just rang until it hung up no answer no voicemail sent letter  ?

## 2022-01-18 ENCOUNTER — Encounter: Payer: Self-pay | Admitting: Physical Therapy

## 2022-01-20 ENCOUNTER — Ambulatory Visit: Payer: BC Managed Care – PPO | Admitting: Physical Therapy

## 2022-01-20 ENCOUNTER — Other Ambulatory Visit: Payer: Self-pay

## 2022-01-20 ENCOUNTER — Telehealth: Payer: Self-pay

## 2022-01-20 DIAGNOSIS — M6281 Muscle weakness (generalized): Secondary | ICD-10-CM

## 2022-01-20 DIAGNOSIS — M25571 Pain in right ankle and joints of right foot: Secondary | ICD-10-CM

## 2022-01-20 DIAGNOSIS — M222X1 Patellofemoral disorders, right knee: Secondary | ICD-10-CM | POA: Diagnosis not present

## 2022-01-20 DIAGNOSIS — M7671 Peroneal tendinitis, right leg: Secondary | ICD-10-CM | POA: Diagnosis not present

## 2022-01-20 DIAGNOSIS — M25561 Pain in right knee: Secondary | ICD-10-CM | POA: Diagnosis not present

## 2022-01-20 DIAGNOSIS — M25562 Pain in left knee: Secondary | ICD-10-CM | POA: Diagnosis not present

## 2022-01-20 DIAGNOSIS — M222X2 Patellofemoral disorders, left knee: Secondary | ICD-10-CM | POA: Diagnosis not present

## 2022-01-20 NOTE — Therapy (Incomplete)
[facility] [address] [place], [place], [address] Phone: [phone]   Fax:  [phone]  Physical Therapy Treatment  Patient Details  Name: Troy Logan MRN: [id number] Date of Birth: [birth year] Referring Provider (PT): Montel Culver, MD   Encounter Date: 01/20/2022   PT End of Session - 01/20/22 1507     Visit Number 4    Number of Visits 7    Date for PT Re-Evaluation 02/04/22    Authorization Type BCBS    Progress Note Due on Visit 7   or final PT visit; [place] 02/05/22   PT Start Time 1501    PT Stop Time 1543    PT Time Calculation (min) 42 min    Activity Tolerance Patient tolerated treatment well;No increased pain    Behavior During Therapy [facility] for tasks assessed/performed             No past medical history on file.  Past Surgical History:  Procedure Laterality Date   APPENDECTOMY      There were no vitals filed for this visit.   Subjective Assessment - 01/20/22 1505     Subjective Patient reports discomfort in his Achilles when starting his 2-mile runs. Patient denies medial/lateral ankle pain or knee pain with his last few runs. He reports this has been consistent with his recent runs. Patient reports doing well with his HEP.    Pertinent History Patient is a 36 year old male with primary complaint of R ankle pain (intermittent lateral ankle pain on opposite side) with history of intermittent bilateral knee pain. 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 He reports pain then began to hurt more along R lateral ankle following this. Patient reports he had subsequent knee pain that has since resolved. He reports having occurrence of lateral ankle pain on his opposite side (left) that has since resolved. Patient reports he had  subsequent knee pain that has since resolved. Patient denies specific region of pain at arrival to PT today. He reports feeling "funny" on his L lateral ankle. Patient reports seldom experiencing numbness/tingling. Patient reports he used to run up to 8-10 kilometers, but has refrained from running recently.    Limitations --   Running, high-impact activities   Diagnostic tests Negative radiograph for R ankle    Patient Stated Goals return to running regimen                 TREATMENT       Neuromuscular Re-education - exercises to promote LE kinetic chain stability, proprioceptive re-training and drills to improve motor control c single-limb loading      SLS with ball toss to plyoboard; 6-lb weight; x20 bilat LE   Reverse lunge - with Blue Tband pulling into valgus to increase demand of controlling for dynamic valgus; x20 bilateral LE  BOSU squat, in // bars for safe stepping on/off; 2x8 with slow eccentric   Forward bound; blue agility ladder 2-3 square length; D/B 6x  [medial ankle pain on last repetition]  Tandem stance on half roll; 2x30 seconds, bilateral   *not today* [place] split squat; 2x10, bilateral LE   Lateral stepdown; 1x10; 6-inch step with bilateral LE with mirror feedback and verbal cueing/demo to improve frontal plane control - reviewed for technique and cueing for dynamic valgus  movement fault       Therapeutic Exercise - for improved soft tissue flexibility and extensibility as needed for ROM, improved strength as needed to improve performance of CKC activities/functional movements   Seated elliptical; seat at 12; Level 6, 5 minutes - for inc tissue temperature for improved muscle performance; interval subjective gathered during this time    Forward and reverse monster walk; with Blue Tband around thighs; 5x D/B agility ladder         *not today* Prone Quad stretch; 3x30sec [place] split squat plyometric; 1x10, bilateral LE for re-training  force attenuation with single-limb landing Treadmill assessment (see above), 8 minutes with 6.0 mph run, 3 min cooldown at 2.8 mph walk       Patient presents with improving movement control fault with stepping down/eccentric lowering. He is able to progress with proprioceptive re-training and stabilization drills without reproduction of knee or ankle pain. Pt has fortunately tolerated low-volume runs (2 miles) well over the last week without reproduction of symptoms. Will continue with low-volume running with plan to gradually transition to graded return to running with conservative progression of duration/volume. Patient will benefit from continued skilled PT intervention for best return to PLOF and return to desired running exercise regimen.          PT Short Term Goals - 01/10/22 1356       PT SHORT TERM GOAL #1   Title Pt will be independent with HEP in order to decrease lower limb pain and increase strength/motor control in order to improve pain-free function at home and work.    Baseline 01/08/22: HEP to be initiated next visit    Time 2    Period Weeks    Status New    Target Date 01/23/22               PT Long Term Goals - 01/13/22 1517       PT LONG TERM GOAL #1   Title Patient will demonstrate improved function as evidenced by increase to 81 score on FOTO measure for full participation in activities at home and in the community.    Baseline 01/08/22: FOTO completed for knee, will complete FOTO for ankle next visit.  01/13/22: FOTO 68    Time 4    Period Weeks    Status New    Target Date 02/05/22      PT LONG TERM GOAL #2   Title Pt will decrease worst pain as reported on NPRS by at least 3 points in order to demonstrate clinically significant reduction in ankle/foot pain.    Baseline 01/08/22: Pain 7/10 at worst    Time 4    Period Weeks    Status New    Target Date 02/05/22      PT LONG TERM GOAL #3   Title Patient will perform single-limb squat without  excessive rolling of ankle or dynamic valgus on either side indicate of improved LE kinetic chain motor control    Baseline 01/08/22: Ankle rolling into external rotation/pronation and dynamic valgus with single limb squatting    Time 4    Period Weeks    Status New    Target Date 02/05/22      PT LONG TERM GOAL #4   Title Patient will iniitate graded return to running program with progression to 20-minute run with 3-minute run/1-minute walk ratio without reproduction of lower limb pain as needed for return to high-volume running required for routine running exercise and training for  Iron[employer]    Baseline 01/08/22: Difficulty with running > 2-3 km    Time 4    Period Weeks    Status New    Target Date 02/05/22                    Patient will benefit from skilled therapeutic intervention in order to improve the following deficits and impairments:     Visit Diagnosis: Pain in right ankle and joints of right foot  Pain in both knees, unspecified chronicity  Muscle weakness (generalized)     Problem List Patient Active Problem List   Diagnosis Date Noted   Bilateral patellofemoral syndrome 07/04/2021   Peroneal tendinitis of right lower extremity 07/04/2021   Valentina Gu, PT, DPT [id number]  Eilleen Kempf, PT 01/20/2022, 3:39 PM  [facility] [address] [place], [place], [address] Phone: [phone]   Fax:  [phone]  Name: Troy Logan MRN: [id number] Date of Birth: [birth year]

## 2022-01-20 NOTE — Telephone Encounter (Signed)
Scheduled for 03/17/2022 °

## 2022-01-21 ENCOUNTER — Encounter: Payer: Self-pay | Admitting: Physical Therapy

## 2022-01-22 ENCOUNTER — Other Ambulatory Visit: Payer: Self-pay

## 2022-01-22 ENCOUNTER — Encounter: Payer: Self-pay | Admitting: Physical Therapy

## 2022-01-22 ENCOUNTER — Ambulatory Visit: Payer: BC Managed Care – PPO | Admitting: Physical Therapy

## 2022-01-22 DIAGNOSIS — M25561 Pain in right knee: Secondary | ICD-10-CM | POA: Diagnosis not present

## 2022-01-22 DIAGNOSIS — M7671 Peroneal tendinitis, right leg: Secondary | ICD-10-CM | POA: Diagnosis not present

## 2022-01-22 DIAGNOSIS — M222X1 Patellofemoral disorders, right knee: Secondary | ICD-10-CM | POA: Diagnosis not present

## 2022-01-22 DIAGNOSIS — M25571 Pain in right ankle and joints of right foot: Secondary | ICD-10-CM

## 2022-01-22 DIAGNOSIS — M6281 Muscle weakness (generalized): Secondary | ICD-10-CM | POA: Diagnosis not present

## 2022-01-22 DIAGNOSIS — M25562 Pain in left knee: Secondary | ICD-10-CM | POA: Diagnosis not present

## 2022-01-22 DIAGNOSIS — M222X2 Patellofemoral disorders, left knee: Secondary | ICD-10-CM | POA: Diagnosis not present

## 2022-01-22 NOTE — Therapy (Signed)
[place] [facility] ?[address] [place], [place], [address] ?Phone: [phone]   Fax:  [phone] ? ?Physical Therapy Treatment ? ?Patient Details  ?Name: Troy Logan ?MRN: [id number] ?Date of Birth: Apr 08, 1986 ?Referring Provider (PT): Jerrol BananaJason J Matthews, MD ? ? ?Encounter Date: 01/22/2022 ? ? PT End of Session - 01/26/22 2250   ? ? Visit Number 5   ? Number of Visits 7   ? Date for PT Re-Evaluation 02/04/22   ? Authorization Type BCBS   ? Progress Note Due on Visit 7   or final PT visit; [place] 02/05/22  ? PT Start Time 1500   ? PT Stop Time 1543   ? PT Time Calculation (min) 43 min   ? Activity Tolerance Patient tolerated treatment well;No increased pain   ? Behavior [facility] for tasks assessed/performed   ? ?  ?  ? ?  ? ? ?History reviewed. No pertinent past medical history. ? ?Past Surgical History:  ?Procedure Laterality Date  ? APPENDECTOMY    ? ? ?There were no vitals filed for this visit. ? ? Subjective Assessment - 01/26/22 2250   ? ? Subjective Patient reports that his Achilles is not bothering him during warm-up today. He still had mild discomfort in Achilles with run yesterday that subsided - he did not warm up beforehand per report. Patient reports doing okay following his last vsit without notable lingering symptoms.   ? Pertinent History Patient is a 36 year old male with primary complaint of R ankle pain (intermittent lateral ankle pain on opposite side) with history of intermittent bilateral knee pain. 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 He reports pain then began to hurt more along R lateral ankle following this. Patient reports he had subsequent knee pain that has since resolved. He reports having occurrence of lateral ankle pain on his opposite side  (left) that has since resolved. Patient reports he had subsequent knee pain that has since resolved. Patient denies specific region of pain at arrival to PT today. He reports feeling "funny" on his L lateral ankle. Patient reports seldom experiencing numbness/tingling. Patient reports he used to run up to 8-10 kilometers, but has refrained from running recently.   ? Limitations --   Running, high-impact activities  ? Diagnostic tests Negative radiograph for R ankle   ? Patient Stated Goals return to running regimen   ? ?  ?  ? ?  ? ? ? ? ? ? ? ? ? ? ? ?  ?TREATMENT ?  ?  ?  ?Neuromuscular Re-education - exercises to promote LE kinetic chain stability, proprioceptive re-training and drills to improve motor control c single-limb loading ?  ?TRX single-limb pistal squat; 2x10 bilateral LE for single-limb loading and lowering movement control  ?  ?SLS on Airex with ball toss to plyoboard; 8-lb weight; x20 bilat LE ?  ?BOSU squat, in // bars for safe stepping on/off; 2x10 with slow eccentric, verbal cueing and demonstration for correct exercise cadence ?  ?Tandem stance on half roll; 2x45 seconds, bilateral ?  ?Total Gym pulse jump with quiet landing; Level 22; 2x15 bilateral LE ?  ? ?*not today* ?Forward bound; blue agility ladder 2-3 square length; D/B 6x; demonstration and verbal cueing for technique/preventing excessive valgus or rolling ankle [medial ankle pain  on last repetition] ?Reverse lunge - with Blue Tband pulling into valgus to increase demand of controlling for dynamic valgus; x20 bilateral LE ?[place] split squat; 2x10, bilateral LE   ?Lateral stepdown; 1x10; 6-inch step with bilateral LE with mirror feedback and verbal cueing/demo to improve frontal plane control - reviewed for technique and cueing for dynamic valgus movement fault  ?  ?  ?  ?Therapeutic Exercise - for improved soft tissue flexibility and extensibility as needed for ROM, improved strength as needed to improve performance of CKC  activities/functional movements ?  ?Treadmill jog (see above), 5 minutes with 6.0 mph run, 3 min cooldown at 2.8 mph walk ?  ?Forward and reverse monster walk; with Black Tband around thighs; 4x D/B agility ladder  ?  ? ? ?Pt edu: Reviewed graded return to running protocol using interval walks/runs with gradually increasing volume ?  ?  ?*not today* ?Prone Quad stretch; 3x30sec ?[place] split squat plyometric; 1x10, bilateral LE for re-training force attenuation with single-limb landing ?Seated elliptical; seat at 12; Level 6, 5 minutes - for inc tissue temperature for improved muscle performance; interval subjective gathered during this time  ?  ?  ?  ?Patient has no significant pain during running and low-level plyometrics and bodyweight partially eliminated. Patient exhibits improving movement control with single-limb lowering and closed-chain hip/knee combined flexion. Patient has minimal pain at this time and will benefit from stepwise conservative return to running pending no regression in pt condition. Patient will benefit from continued skilled PT intervention for best return to PLOF and return to desired running exercise regimen.  ?  ?  ? PT Short Term Goals - 01/10/22 1356   ? ?  ? PT SHORT TERM GOAL #1  ? Title Pt will be independent with HEP in order to decrease lower limb pain and increase strength/motor control in order to improve pain-free function at home and work.   ? Baseline 01/08/22: HEP to be initiated next visit   ? Time 2   ? Period Weeks   ? Status New   ? Target Date 01/23/22   ? ?  ?  ? ?  ? ? ? ? PT Long Term Goals - 01/13/22 1517   ? ?  ? PT LONG TERM GOAL #1  ? Title Patient will demonstrate improved function as evidenced by increase to 81 score on FOTO measure for full participation in activities at home and in the community.   ? Baseline 01/08/22: FOTO completed for knee, will complete FOTO for ankle next visit.  01/13/22: FOTO 68   ? Time 4   ? Period Weeks   ? Status New   ? Target Date  02/05/22   ?  ? PT LONG TERM GOAL #2  ? Title Pt will decrease worst pain as reported on NPRS by at least 3 points in order to demonstrate clinically significant reduction in ankle/foot pain.   ? Baseline 01/08/22: Pain 7/10 at worst   ? Time 4   ? Period Weeks   ? Status New   ? Target Date 02/05/22   ?  ? PT LONG TERM GOAL #3  ? Title Patient will perform single-limb squat without excessive rolling of ankle or dynamic valgus on either side indicate of improved LE kinetic chain motor control   ? Baseline 01/08/22: Ankle rolling into external rotation/pronation and dynamic valgus with single limb squatting   ? Time 4   ? Period Weeks   ? Status New   ?  Target Date 02/05/22   ?  ? PT LONG TERM GOAL #4  ? Title Patient will iniitate graded return to running program with progression to 20-minute run with 3-minute run/1-minute walk ratio without reproduction of lower limb pain as needed for return to high-volume running required for routine running exercise and training for [employer]   ? Baseline 01/08/22: Difficulty with running > 2-3 km   ? Time 4   ? Period Weeks   ? Status New   ? Target Date 02/05/22   ? ?  ?  ? ?  ? ? ? ? ? ? ? ? Plan - 01/26/22 2254   ? ? Clinical Impression Statement Patient has no significant pain during running and low-level plyometrics and bodyweight partially eliminated. Patient exhibits improving movement control with single-limb lowering and closed-chain hip/knee combined flexion. Patient has minimal pain at this time and will benefit from stepwise conservative return to running pending no regression in pt condition. Patient will benefit from continued skilled PT intervention for best return to PLOF and return to desired running exercise regimen.   ? Personal Factors and Comorbidities Past/Current Experience;Time since onset of injury/illness/exacerbation   ? 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 Examination-Participation Restrictions --    Iron[employer] triathlon  ? Stability/Clinical Decision Making Evolving/Moderate complexity   ? Rehab Potential Good   ? PT Frequency --   1-2x/week  ? PT Duration 4 weeks   ? PT Treatment/Interventions ADLs/Self Care Home

## 2022-01-27 ENCOUNTER — Ambulatory Visit: Payer: BC Managed Care – PPO | Admitting: Physical Therapy

## 2022-01-27 ENCOUNTER — Other Ambulatory Visit: Payer: Self-pay

## 2022-01-27 ENCOUNTER — Encounter: Payer: Self-pay | Admitting: Physical Therapy

## 2022-01-27 DIAGNOSIS — M25561 Pain in right knee: Secondary | ICD-10-CM

## 2022-01-27 DIAGNOSIS — M7671 Peroneal tendinitis, right leg: Secondary | ICD-10-CM | POA: Diagnosis not present

## 2022-01-27 DIAGNOSIS — M222X1 Patellofemoral disorders, right knee: Secondary | ICD-10-CM | POA: Diagnosis not present

## 2022-01-27 DIAGNOSIS — M25571 Pain in right ankle and joints of right foot: Secondary | ICD-10-CM | POA: Diagnosis not present

## 2022-01-27 DIAGNOSIS — M6281 Muscle weakness (generalized): Secondary | ICD-10-CM

## 2022-01-27 DIAGNOSIS — M25562 Pain in left knee: Secondary | ICD-10-CM | POA: Diagnosis not present

## 2022-01-27 DIAGNOSIS — M222X2 Patellofemoral disorders, left knee: Secondary | ICD-10-CM | POA: Diagnosis not present

## 2022-01-27 NOTE — Therapy (Signed)
[place] ?[facility] ?[address] [place], [place], [address] ?Phone: [phone]   Fax:  [phone] ? ?Physical Therapy Treatment ? ?Patient Details  ?Name: Troy Logan ?MRN: [id number] ?Date of Birth: 02/14/1986 ?Referring Provider (PT): Jerrol BananaJason J Matthews, MD ? ? ?Encounter Date: 01/27/2022 ? ? PT End of Session - 01/29/22 0711   ? ? Visit Number 6   ? Number of Visits 7   ? Date for PT Re-Evaluation 02/04/22   ? Authorization Type BCBS   ? Progress Note Due on Visit 7   or final PT visit; [place] the [place] 02/05/22  ? PT Start Time 1503   ? PT Stop Time 1543   ? PT Time Calculation (min) 40 min   ? Activity Tolerance Patient tolerated treatment well;No increased pain   ? Behavior During Therapy [facility] for tasks assessed/performed   ? ?  ?  ? ?  ? ? ?History reviewed. No pertinent past medical history. ? ?Past Surgical History:  ?Procedure Laterality Date  ? APPENDECTOMY    ? ? ?There were no vitals filed for this visit. ? ? Subjective Assessment - 01/29/22 0710   ? ? Subjective Patient reports tolerating recent running program well. He reports intermittent Achilles tendon discomfort at initiation of running that does seem better when he is warmed up. He reports compliance with his HEP. Pt reports tolerating last session well.   ? Pertinent History Patient is a 36 year old male with primary complaint of R ankle pain (intermittent lateral ankle pain on opposite side) with history of intermittent bilateral knee pain. 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 He reports pain then began to hurt more along R lateral ankle following this. Patient reports he had subsequent knee pain that has since resolved. He reports having occurrence of lateral ankle pain on his opposite side (left) that has since resolved.  Patient reports he had subsequent knee pain that has since resolved. Patient denies specific region of pain at arrival to PT today. He reports feeling "funny" on his L lateral ankle. Patient reports seldom experiencing numbness/tingling. Patient reports he used to run up to 8-10 kilometers, but has refrained from running recently.   ? Limitations --   Running, high-impact activities  ? Diagnostic tests Negative radiograph for R ankle   ? Patient Stated Goals return to running regimen   ? ?  ?  ? ?  ? ? ?  ? ?TREATMENT ?  ?  ?  ?Neuromuscular Re-education - exercises to promote LE kinetic chain stability, proprioceptive re-training and drills to improve motor control c single-limb loading ?  ?TRX single-limb pistal squat; 2x10 bilateral LE for single-limb loading and lowering movement control  ? ?TRX alternating lateral skater jump; 2x12 alternating  ?  ?SLS on Airex with ball toss to therapist, multi-directional throws (anterior/anterolateral); 6-lb weight; x20 bilat LE ?  ?Tandem stance on half roll with trunk rotation; 2x10 each direction, bilateral ? ?Forward bound; x10 bilateral LE with focus on quiet landing/force attenuation; performed on agility ladder with verbal cueing and demonstration for frontal pain control and "soft landing" ? ?3-way slider, stance limb on Airex; x5 ea dir, bilateral lower extremities ?  ? ?  ?  ?*not today* ?Total Gym pulse jump with quiet landing; Level 22; 2x15 bilateral LE ?BOSU squat, in //  bars for safe stepping on/off; 2x10 with slow eccentric, verbal cueing and demonstration for correct exercise cadence ?Forward bound; blue agility ladder 2-3 square length; D/B 6x; demonstration and verbal cueing for technique/preventing excessive valgus or rolling ankle [medial ankle pain on last repetition] ?Reverse lunge - with Blue Tband pulling into valgus to increase demand of controlling for dynamic valgus; x20 bilateral LE ?[place] split squat; 2x10, bilateral LE   ?Lateral stepdown;  1x10; 6-inch step with bilateral LE with mirror feedback and verbal cueing/demo to improve frontal plane control - reviewed for technique and cueing for dynamic valgus movement fault  ?  ?  ?  ?Therapeutic Exercise - for improved soft tissue flexibility and extensibility as needed for ROM, improved strength as needed to improve performance of CKC activities/functional movements ?  ?Treadmill jog (see above), 5 minutes with 6.0 mph run, 3 min cooldown at 3.0 mph walk ?  ?Single-leg squat assessment: patient demonstrates mild dynamic valgus R>LLE, sound descending control and position of trunk relative to tibia of stance leg ? ?  ?*not today* ?Forward and reverse monster walk; with Black Tband around thighs; 4x D/B agility ladder  ?Prone Quad stretch; 3x30sec ?[place] split squat plyometric; 1x10, bilateral LE for re-training force attenuation with single-limb landing ?Seated elliptical; seat at 12; Level 6, 5 minutes - for inc tissue temperature for improved muscle performance; interval subjective gathered during this time  ?  ?  ?  ?Patient has minimal pain with activities performed today. He demonstrates improving motor control with single-limb loading/lowering. Pt is able to progress intensity of plyometrics without significant pain. He has initiated graded return to running program outside of clinic and has tolerated this well. Pt is approaching his trip to [place] (pt flying out 02/05/22); will ensure pt has robust home-based exercise program and can continue gradual progression of running volume per program provided.  Patient will benefit from continued skilled PT intervention for best return to PLOF and return to desired running exercise regimen.  ?  ? ? ? ? ? PT Short Term Goals - 01/10/22 1356   ? ?  ? PT SHORT TERM GOAL #1  ? Title Pt will be independent with HEP in order to decrease lower limb pain and increase strength/motor control in order to improve pain-free function at home and work.   ? Baseline  01/08/22: HEP to be initiated next visit   ? Time 2   ? Period Weeks   ? Status New   ? Target Date 01/23/22   ? ?  ?  ? ?  ? ? ? ? PT Long Term Goals - 01/13/22 1517   ? ?  ? PT LONG TERM GOAL #1  ? Title Patient will demonstrate improved function as evidenced by increase to 81 score on FOTO measure for full participation in activities at home and in the community.   ? Baseline 01/08/22: FOTO completed for knee, will complete FOTO for ankle next visit.  01/13/22: FOTO 68   ? Time 4   ? Period Weeks   ? Status New   ? Target Date 02/05/22   ?  ? PT LONG TERM GOAL #2  ? Title Pt will decrease worst pain as reported on NPRS by at least 3 points in order to demonstrate clinically significant reduction in ankle/foot pain.   ? Baseline 01/08/22: Pain 7/10 at worst   ? Time 4   ? Period Weeks   ? Status New   ? Target Date 02/05/22   ?  ?  PT LONG TERM GOAL #3  ? Title Patient will perform single-limb squat without excessive rolling of ankle or dynamic valgus on either side indicate of improved LE kinetic chain motor control   ? Baseline 01/08/22: Ankle rolling into external rotation/pronation and dynamic valgus with single limb squatting   ? Time 4   ? Period Weeks   ? Status New   ? Target Date 02/05/22   ?  ? PT LONG TERM GOAL #4  ? Title Patient will iniitate graded return to running program with progression to 20-minute run with 3-minute run/1-minute walk ratio without reproduction of lower limb pain as needed for return to high-volume running required for routine running exercise and training for [employer]   ? Baseline 01/08/22: Difficulty with running > 2-3 km   ? Time 4   ? Period Weeks   ? Status New   ? Target Date 02/05/22   ? ?  ?  ? ?  ? ? ? ? ? ? ? ? Plan - 01/29/22 1252   ? ? Clinical Impression Statement Patient has minimal pain with activities performed today. He demonstrates improving motor control with single-limb loading/lowering. Pt is able to progress intensity of plyometrics without significant pain. He has  initiated graded return to running program outside of clinic and has tolerated this well. Pt is approaching his trip to [place] (pt flying out 02/05/22); will ensure pt has robust home-based exercise program an

## 2022-02-03 ENCOUNTER — Other Ambulatory Visit: Payer: Self-pay

## 2022-02-03 ENCOUNTER — Ambulatory Visit: Payer: BC Managed Care – PPO | Admitting: Physical Therapy

## 2022-02-03 DIAGNOSIS — M7671 Peroneal tendinitis, right leg: Secondary | ICD-10-CM | POA: Diagnosis not present

## 2022-02-03 DIAGNOSIS — M6281 Muscle weakness (generalized): Secondary | ICD-10-CM | POA: Diagnosis not present

## 2022-02-03 DIAGNOSIS — M222X2 Patellofemoral disorders, left knee: Secondary | ICD-10-CM | POA: Diagnosis not present

## 2022-02-03 DIAGNOSIS — M25571 Pain in right ankle and joints of right foot: Secondary | ICD-10-CM | POA: Diagnosis not present

## 2022-02-03 DIAGNOSIS — M25561 Pain in right knee: Secondary | ICD-10-CM | POA: Diagnosis not present

## 2022-02-03 DIAGNOSIS — M25562 Pain in left knee: Secondary | ICD-10-CM | POA: Diagnosis not present

## 2022-02-03 DIAGNOSIS — M222X1 Patellofemoral disorders, right knee: Secondary | ICD-10-CM | POA: Diagnosis not present

## 2022-02-03 NOTE — Therapy (Signed)
?OUTPATIENT PHYSICAL THERAPY TREATMENT NOTE ? ? ?Patient Name: Troy Logan ?MRN: [id number] ?DOB:September 03, 1986, 36 y.o., male ?Today's Date: 02/03/2022 ? ?PCP: Juline Patch, MD ?REFERRING PROVIDER: Montel Culver, MD ? ? PT End of Session - 02/03/22 1603   ? ? Visit Number 7   ? Number of Visits 7   ? Date for PT Re-Evaluation 02/04/22   ? Authorization Type BCBS   ? Progress Note Due on Visit 7   or final PT visit; pt going out of the country 02/05/22  ? PT Start Time 7673   ? PT Stop Time 1630   ? PT Time Calculation (min) 41 min   ? Activity Tolerance Patient tolerated treatment well;No increased pain   ? Behavior During Therapy [facility] for tasks assessed/performed   ? ?  ?  ? ?  ? ? ? ?No past medical history on file. ?Past Surgical History:  ?Procedure Laterality Date  ? APPENDECTOMY    ? ?Patient Active Problem List  ? Diagnosis Date Noted  ? 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 Peroneal tendinitis of right lower extremity 07/04/2021  ? ? ?REFERRING DIAG: M22.2X1,M22.2X2 (ICD-10-CM) - Bilateral patellofemoral syndrome M76.71 (ICD-10-CM) - Peroneal tendinitis of right lower extremity  ? ? ?THERAPY DIAG:  ?Pain in right ankle and joints of right foot ? ?Pain in both knees, unspecified chronicity ? ?Muscle weakness (generalized) ? ? ?PERTINENT HISTORY: Patient is a 36 year old male with primary complaint of R ankle pain (intermittent lateral ankle pain on opposite side) with history of intermittent bilateral knee pain. 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 He reports pain then began to hurt more along R lateral ankle following this. Patient reports he had subsequent knee pain that has since resolved. He reports having occurrence of lateral ankle pain on his opposite side (left) that has since resolved. Patient reports he had subsequent  knee pain that has since resolved. Patient denies specific region of pain at arrival to PT today. He reports feeling "funny" on his L lateral ankle. Patient reports seldom experiencing numbness/tingling. Patient reports he used to run up to 8-10 kilometers, but has refrained from running recently.  ? ?PRECAUTIONS: None ? ?SUBJECTIVE: Patient reports not running over last week due to pt being in wedding party and due to busy schedule. He reports no pain at arrival to PT. Patient will be traveling to [place] this Wednesday and states that he will continue exercises and graded return to running as instructed in PT. Patient reports tolerating last visit well. He reports pain up to 1-2/10 at the highest over the previous week.  ? ?PAIN:  ?Are you having pain? No ? ? ?OBJECTIVE ? ? ?Movement Screen ?Single-limb squat: Mild valgus RLE only ?Single-leg stance: Pt maintains neutral hindfoot with minimal postural sway bilaterally  ?Lateral stepdown: RLE mild varus/valgus initially that is self-corrected, LLE WFL ?Forward bound: Sound landing mechanics with no pelvic drop or dynamic valgus, no excessive ankle pronation/supination ? ? ? ?TODAY'S TREATMENT:  ?  ?TREATMENT ?  ?  ?  ?Neuromuscular Re-education - exercises to promote LE kinetic chain stability, proprioceptive re-training and drills to improve motor control c single-limb loading ?  ?TRX single-limb pistal squat; 1x10 bilateral LE for single-limb loading and lowering movement control  ? ?Pistol squat with no upper limb support; 1x10 bilateral LE for improved LE motor  control/kinesthesia with increased degress of freedom ?  ?Minisquat isometric on BOSU with plyoboard toss, 6-lb ball; 2x15  ?  ?Tandem stance on half roll with trunk rotation; 2x10 each direction, bilateral ?  ?Forward bound; x10 bilateral LE with focus on quiet landing/force attenuation; performed on agility ladder with verbal cueing and demonstration for frontal pain control and "soft landing" ?   ?3-way slider, stance limb on Airex; x5 ea dir, bilateral lower extremities ?  ?  ?  ?  ?*not today* ?TRX alternating lateral skater jump; 2x12 alternating  ?Total Gym pulse jump with quiet landing; Level 22; 2x15 bilateral LE ?BOSU squat, in // bars for safe stepping on/off; 2x10 with slow eccentric, verbal cueing and demonstration for correct exercise cadence ?Forward bound; blue agility ladder 2-3 square length; D/B 6x; demonstration and verbal cueing for technique/preventing excessive valgus or rolling ankle [medial ankle pain on last repetition] ?Reverse lunge - with Blue Tband pulling into valgus to increase demand of controlling for dynamic valgus; x20 bilateral LE ?Bulgarian split squat; 2x10, bilateral LE   ?Lateral stepdown; 1x10; 6-inch step with bilateral LE with mirror feedback and verbal cueing/demo to improve frontal plane control - reviewed for technique and cueing for dynamic valgus movement fault  ?  ?  ?  ?Therapeutic Exercise - for improved soft tissue flexibility and extensibility as needed for ROM, improved strength as needed to improve performance of CKC activities/functional movements ?  ?Treadmill jog (see above), 5 minutes with 6.0 mph run, 3 min cooldown at 3.0 mph walk ?  ?Goal Update performed (see above and updated goals below) ?  ?*not today* ?Forward and reverse monster walk; with Black Tband around thighs; 4x D/B agility ladder  ?Prone Quad stretch; 3x30sec ?Bulgarian split squat plyometric; 1x10, bilateral LE for re-training force attenuation with single-limb landing ?Seated elliptical; seat at 12; Level 6, 5 minutes - for inc tissue temperature for improved muscle performance; interval subjective gathered during this time  ?  ? ? ?PATIENT EDUCATION: ?Education details: Educated patient on current progress with PT, goals met, instructions for graded return to running. Reviewed and updated home exercise program.  ?Person educated: Patient ?Education method: Explanation,  Demonstration, and Handouts ?Education comprehension: verbalized understanding ? ? ?HOME EXERCISE PROGRAM: ?Access Code: [email or web address] ? ? PT Short Term Goals  ? ?  ? PT SHORT TERM GOAL #1  ? Title Pt will be independent with HEP in order to decrease lower limb pain and increase strength/motor control in order to improve pain-free function at home and work.   ? Baseline 01/08/22: HEP to be initiated next visit.  02/03/22: Patient demonstrates sound understanding of exercises and return to running program, pt has maintained HEP through this plan of care  ? Time 2   ? Period Weeks   ? Status Achieved   ? Target Date 01/23/22   ? ?  ?  ? ?  ? ? ? ?  ? PT Long Term Goals  ? ?  ? PT LONG TERM GOAL #1  ? Title Patient will demonstrate improved function as evidenced by increase to 81 score on FOTO measure for full participation in activities at home and in the community.   ? Baseline 01/08/22: FOTO completed for knee, will complete FOTO for ankle next visit.  01/13/22: FOTO 68.  02/04/22: FOTO 86.   ? Time 4   ? Period Weeks   ? Status Achieved   ? Target Date 02/05/22   ?  ? PT LONG TERM   GOAL #2  ? Title Pt will decrease worst pain as reported on NPRS by at least 3 points in order to demonstrate clinically significant reduction in ankle/foot pain.   ? Baseline 01/08/22: Pain 7/10 at worst.  02/03/22: 1-2/10 at worst   ? Time 4   ? Period Weeks   ? Status Achieved   ? Target Date 02/05/22   ?  ? PT LONG TERM GOAL #3  ? Title Patient will perform single-limb squat without excessive rolling of ankle or dynamic valgus on either side indicate of improved LE kinetic chain motor control   ? Baseline 01/08/22: Ankle rolling into external rotation/pronation and dynamic valgus with single limb squatting.   02/03/22: Mild valgus RLE only. Patient demonstrates self-correction with lateral stepdown c RLE; good single-limb lowering LLE  ? Time 4   ? Period Weeks   ? Status Partially Met   ? Target Date 02/05/22   ?  ? PT LONG TERM GOAL #4  ? Title  Patient will iniitate graded return to running program with progression to 20-minute run with 3-minute run/1-minute walk ratio without reproduction of lower limb pain as needed for return to high-volume running required for

## 2022-02-04 ENCOUNTER — Encounter: Payer: Self-pay | Admitting: Physical Therapy

## 2022-03-17 ENCOUNTER — Ambulatory Visit (INDEPENDENT_AMBULATORY_CARE_PROVIDER_SITE_OTHER): Payer: BC Managed Care – PPO | Admitting: Gastroenterology

## 2022-03-17 ENCOUNTER — Encounter: Payer: Self-pay | Admitting: Gastroenterology

## 2022-03-17 NOTE — Progress Notes (Signed)
? ? ?Gastroenterology Consultation ? ?Referring Provider:     Duanne Limerick, MD ?Primary Care Physician:  Duanne Limerick, MD ?Primary Gastroenterologist:  Dr. Servando Snare     ?Reason for Consultation:     Elevated bilirubin ?      ? HPI:   ?Troy Logan is a 36 y.o. y/o male referred for consultation & management of elevated bilirubin by Dr. Yetta Barre, Vanita Panda, MD. This patient comes to see me after being found to have abnormal liver enzymes with an elevated bilirubin being the only thing abnormal.  The patient's most recent lab work showed: ? ?Component ?    Latest Ref Rng 12/07/2020 01/15/2022  ?Total Bilirubin ?    0.0 - 1.2 mg/dL 2.1 (H)  2.1 (H)   ?BILIRUBIN, DIRECT ?    0.00 - 0.40 mg/dL 5.03  5.46 (H)   ?Alkaline Phosphatase ?    44 - 121 IU/L 84  71   ?AST ?    0 - 40 IU/L 11  20   ?ALT ?    0 - 44 IU/L 13  23   ? ?The patient reports that he also has a brother with Gilberts syndrome. ? ? ?No past medical history on file. ? ?Past Surgical History:  ?Procedure Laterality Date  ? APPENDECTOMY    ? ? ?Prior to Admission medications   ?Medication Sig Start Date End Date Taking? Authorizing Provider  ?Cyanocobalamin (VITAMIN B 12 PO) Take by mouth as needed.    [provider]  ?meloxicam (MOBIC) 15 MG tablet Take 1 tablet (15 mg total) by mouth daily as needed for pain. 11/26/21   Jerrol Banana, MD  ? ? ?Family History  ?Problem Relation Age of Onset  ? Hypertension Father   ? Cancer Maternal Grandfather   ? Cancer Paternal Grandmother   ?  ? ?Social History  ? ?Tobacco Use  ? Smoking status: Never  ? Smokeless tobacco: Never  ?Vaping Use  ? Vaping Use: Never used  ?Substance Use Topics  ? Alcohol use: Yes  ? Drug use: Not Currently  ? ? ?Allergies as of 03/17/2022  ? (No Known Allergies)  ? ? ?Review of Systems:    ?All systems reviewed and negative except where noted in HPI. ? ? Physical Exam:  ?There were no vitals taken for this visit. ?No LMP for male patient. ?General:   Alert,  Well-developed,  well-nourished, pleasant and cooperative in NAD ?Head:  Normocephalic and atraumatic. ?Eyes:  Sclera clear, no icterus.   Conjunctiva pink. ?Neurologic:  Alert and oriented x3;  grossly normal neurologically. ?Skin:  Intact without significant lesions or rashes.  No jaundice. ?Psych:  Alert and cooperative. Normal mood and affect. ? ?Imaging Studies: ?No results found. ? ?Assessment and Plan:  ? ?Troy Logan is a 36 y.o. y/o male who comes in with isolated hyperbilirubinemia with the majority of the bilirubin being indirect and the minority of it being direct from the liver.  This represents Gilbert's syndrome.  The patient has been informed that this is a condition that causes a substance called ?bilirubin? to build up in the blood.  Gilbert?s syndrome is caused by an abnormal gene that runs in families. People who have Gilbert?s syndrome were born with it.  Gilbert?s syndrome does not need treatment. ? ? ?Midge Minium, MD. Clementeen Graham ? ? ? Note: This dictation was prepared with Dragon dictation along with smaller phrase technology. Any transcriptional errors that result from this process are unintentional.   ?

## 2022-05-26 ENCOUNTER — Encounter: Payer: Self-pay | Admitting: Family Medicine

## 2022-05-26 ENCOUNTER — Ambulatory Visit (INDEPENDENT_AMBULATORY_CARE_PROVIDER_SITE_OTHER): Payer: BC Managed Care – PPO | Admitting: Family Medicine

## 2022-05-26 ENCOUNTER — Ambulatory Visit
Admission: RE | Admit: 2022-05-26 | Discharge: 2022-05-26 | Disposition: A | Discharge status: Home / Self Care | Payer: BC Managed Care – PPO | Source: Ambulatory Visit | Attending: Family Medicine | Admitting: Family Medicine

## 2022-05-26 ENCOUNTER — Ambulatory Visit
Admission: RE | Admit: 2022-05-26 | Discharge: 2022-05-26 | Disposition: A | Discharge status: Home / Self Care | Payer: BC Managed Care – PPO | Attending: Family Medicine | Admitting: Family Medicine

## 2022-05-26 VITALS — BP 100/70 | HR 80 | Ht 75.0 in | Wt 203.0 lb

## 2022-05-26 DIAGNOSIS — M25532 Pain in left wrist: Secondary | ICD-10-CM | POA: Diagnosis not present

## 2022-05-26 MED ORDER — PREDNISONE 10 MG PO TABS: 10.0000 mg | ORAL_TABLET | Freq: Every day | ORAL | 0 refills | Status: DC

## 2022-05-26 NOTE — Progress Notes (Signed)
Date:  05/26/2022   Name:  Troy Logan   DOB:  1986-09-17   MRN:  [id number]   Chief Complaint: Wrist Pain (L) wrist pain started a month ago- noticed it when lifting infant. Tried icing and exercises for it, not helping. Meloxicam helped some, but not completely)  Wrist Pain  The pain is present in the left wrist. This is a new problem. The current episode started more than 1 month ago. The problem has been waxing and waning. The quality of the pain is described as aching. The pain is moderate. Pertinent negatives include no fever.    Lab Results  Component Value Date   NA 148 (H) 01/15/2022   K 4.6 01/15/2022   CO2 26 01/15/2022   GLUCOSE 88 01/15/2022   BUN 10 01/15/2022   CREATININE 0.97 01/15/2022   CALCIUM 9.5 01/15/2022   EGFR 104 01/15/2022   GFRNONAA 100 12/07/2020   Lab Results  Component Value Date   CHOL 181 01/15/2022   HDL 66 01/15/2022   LDLCALC 104 (H) 01/15/2022   TRIG 56 01/15/2022   No results found for: "TSH" No results found for: "HGBA1C" Lab Results  Component Value Date   WBC 4.1 01/15/2022   HGB 14.5 01/15/2022   HCT 41.0 01/15/2022   MCV 91 01/15/2022   PLT 224 01/15/2022   Lab Results  Component Value Date   ALT 23 01/15/2022   AST 20 01/15/2022   ALKPHOS 71 01/15/2022   BILITOT 2.1 (H) 01/15/2022   No results found for: "25OHVITD2", "25OHVITD3", "VD25OH"   Review of Systems  Constitutional:  Negative for chills and fever.  HENT:  Negative for drooling, ear discharge, ear pain and sore throat.   Respiratory:  Negative for cough, shortness of breath and wheezing.   Cardiovascular:  Negative for chest pain.  Gastrointestinal:  Negative for abdominal pain, diarrhea and nausea.  Endocrine: Negative for polydipsia and polyuria.  Musculoskeletal:  Positive for arthralgias. Negative for myalgias and neck pain.  Skin:  Negative for rash.  Allergic/Immunologic: Negative for environmental allergies.  Neurological:  Negative for dizziness  and headaches.  Hematological:  Does not bruise/bleed easily.  Psychiatric/Behavioral:  Negative for suicidal ideas. The patient is not nervous/anxious.     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  Current Meds  Medication Sig   Cyanocobalamin (VITAMIN B 12 PO) Take by mouth as needed.       05/26/2022    4:15 PM 01/15/2022    8:55 AM 07/04/2021    1:37 PM 05/28/2021    2:04 PM  GAD 7 : Generalized Anxiety Score  Nervous, Anxious, on Edge 0 0 1 1  Control/stop worrying 0 0 0 0  Worry too much - different things 0 0 0 0  Trouble relaxing 0 0 1 0  Restless 0 0 0 0  Easily annoyed or irritable 0 0 1 1  Afraid - awful might happen 0 0 0 0  Total GAD 7 Score 0 0 3 2  Anxiety Difficulty Not difficult at all Not difficult at all Not difficult at all        05/26/2022    4:15 PM 01/15/2022    8:55 AM 07/04/2021    1:37 PM  Depression screen PHQ 2/9  Decreased Interest 0 0 0  Down, Depressed, Hopeless 0 0 0  PHQ - 2 Score 0 0 0  Altered sleeping 1 0 0  Tired, decreased energy 0 0 0  Change in appetite 1 0 0  Feeling bad or failure about yourself  0 0 0  Trouble concentrating 0 0 0  Moving slowly or fidgety/restless 0 0 0  Suicidal thoughts 0 0 0  PHQ-9 Score 2 0 0  Difficult doing work/chores Not difficult at all Not difficult at all Not difficult at all    BP Readings from Last 3 Encounters:  05/26/22 100/70  03/17/22 136/81  01/15/22 120/80    Physical Exam Vitals and nursing note reviewed.  Musculoskeletal:      Left wrist: Tenderness and bony tenderness present. No swelling, deformity or snuff box tenderness. Decreased range of motion.     Wt Readings from Last 3 Encounters:  05/26/22 203 lb (92.1 kg)  03/17/22 204 lb (92.5 kg)  01/15/22 207 lb (93.9 kg)    BP 100/70   Pulse 80   Ht _0  (1.905 m)   Wt 203 lb (92.1 kg)   BMI 25.37 kg/m   Assessment and Plan:  ....Marland Kitchen1. Left wrist pain New onset.  For about 1 to 2 months.  Pain in the dorsal aspect of the left wrist when in dorsiflexion such as push-ups.  We will obtain a an x-ray of the wrist and since he is not resolving on meloxicam and that he is currently taking we will replace with prednisone taper starting at 40 mg over 12 days with Tylenol as needed pain.  Patient is also been instructed to avoid upper body strength exercises. - DG Wrist Complete Left - predniSONE (DELTASONE) 10 MG tablet; Take 1 tablet (10 mg total) by mouth daily with breakfast. Taper 4,4,4,3,3,3,2,2,2,1,1,1  Dispense: 30 tablet; Refill: 0

## 2022-05-27 ENCOUNTER — Ambulatory Visit: Payer: Self-pay | Admitting: *Deleted

## 2022-05-27 NOTE — Telephone Encounter (Signed)
Per agent: "Pt needs help on directions for Prednisone RX/ please advise"  Chief Complaint: Medication Advise Symptoms: NA Frequency: NA Pertinent Negatives: Patient denies NA Disposition: [] ED /[] Urgent Care (no appt availability in office) / [] Appointment(In office/virtual)/ []  [place] [facility]/ [x] Home Care/ [] Refused Recommended Disposition /[] [place] [facility]/ []  Follow-up with PCP Additional Notes: Pt questioning how to take prednisone ordered by Dr. . Did review med and appt note, ordered as taper, advised as directed .10 mg tabs, 4,4,4,3,3,3,2,2,2,1,1,1. Total 30 tabs. Pt verbalizes understanding.     Reason for Disposition  Caller has medicine question only, adult not sick, AND triager answers question  Answer Assessment - Initial Assessment Questions 1. NAME of MEDICINE: "What medicine(s) are you calling about?"     Prednisone. 2. QUESTION: "What is your question?" (e.g., double dose of medicine, side effect)     how to take 3. PRESCRIBER: "Who prescribed the medicine?" Reason: if prescribed by specialist, call should be referred to that group.     Dr.  Protocols used: Medication Question Call-A-AH

## 2022-05-29 ENCOUNTER — Ambulatory Visit (INDEPENDENT_AMBULATORY_CARE_PROVIDER_SITE_OTHER): Payer: BC Managed Care – PPO | Admitting: Family Medicine

## 2022-05-29 VITALS — BP 110/78 | HR 80 | Ht 75.0 in | Wt 203.0 lb

## 2022-05-29 DIAGNOSIS — M654 Radial styloid tenosynovitis [de Quervain]: Secondary | ICD-10-CM | POA: Diagnosis not present

## 2022-05-29 MED ORDER — DICLOFENAC SODIUM 75 MG PO TBEC: 75.0000 mg | DELAYED_RELEASE_TABLET | Freq: Two times a day (BID) | ORAL | 0 refills | Status: AC | PRN

## 2022-05-29 NOTE — Assessment & Plan Note (Signed)
Right-hand-dominant patient presenting with left radial wrist pain ongoing for roughly 4 months in the setting of caring for her young child and typing for work.  He did see his PCP Dr. [name] recently who wrote for a course of prednisone which did improve his symptoms significantly.  He had some x-rays done as well.  His examination reveals subtle tenderness about the radial styloid, positive Finkelstein's test, otherwise full painless range of motion noted.  His findings are most consistent with Tommi Rumps Quervain's radial styloid tenosynovitis, he does describe some secondary and thought compensatory flexor tendinitis at the wrist which seems to have resolved with the prednisone.  Given the prolonged duration of symptoms and findings today despite prednisone, plan for thumb spica brace, adjunct diclofenac with prednisone, home-based rehab, and follow-up after his trip abroad to [place] if he is still symptomatic.  I did discuss that if he is still symptomatic during his trip, seek local medical attention for possible radial styloid cortisone injection.

## 2022-05-29 NOTE — Progress Notes (Signed)
     Primary Care / Sports Medicine Office Visit  Patient Information:  Patient ID: Troy Logan, male DOB: 1986/09/15 Age: 36 y.o. MRN: [id number]   Troy Logan is a pleasant 36 y.o. male presenting with the following:  Chief Complaint  Patient presents with   Left wrist pain    Feeling better since being on prednisone. No known injury and been bothering him for a couple months.     Vitals:   05/29/22 1431  BP: 110/78  Pulse: 80  SpO2: 98%   Vitals:   05/29/22 1431  Weight: 203 lb (92.1 kg)  Height: 6\' 3"  (1.905 m)   Body mass index is 25.37 kg/m.  DG Wrist Complete Left  Result Date: 05/26/2022 CLINICAL DATA:  Wrist pain with dorsiflexion. EXAM: LEFT WRIST - COMPLETE 3+ VIEW COMPARISON:  None FINDINGS: There is no evidence of fracture or dislocation. There is no evidence of arthropathy or other focal bone abnormality. Soft tissues are unremarkable. IMPRESSION: Normal radiographs.  No visible traumatic or degenerative finding. Electronically Signed   By: 05/28/2022 M.D.   On: 05/26/2022 16:58     Independent interpretation of notes and tests performed by another provider:   Independent interpretation of left wrist x-ray dated 05/26/2022 reveals subtle soft tissue swelling at the radial styloid, otherwise no joint space loss and no acute osseous processes noted.  Procedures performed:   None  Pertinent History, Exam, Impression, and Recommendations:   Problem List Items Addressed This Visit       Musculoskeletal and Integument   Radial styloid tenosynovitis (de quervain) - Primary    Right-hand-dominant patient presenting with left radial wrist pain ongoing for roughly 4 months in the setting of caring for her young child and typing for work.  He did see his PCP Dr. 05/28/2022 recently who wrote for a course of prednisone which did improve his symptoms significantly.  He had some x-rays done as well.  His examination reveals subtle tenderness about the radial  styloid, positive Finkelstein's test, otherwise full painless range of motion noted.  His findings are most consistent with [place] Quervain's radial styloid tenosynovitis, he does describe some secondary and thought compensatory flexor tendinitis at the wrist which seems to have resolved with the prednisone.  Given the prolonged duration of symptoms and findings today despite prednisone, plan for thumb spica brace, adjunct diclofenac with prednisone, home-based rehab, and follow-up after his trip abroad to [place] if he is still symptomatic.  I did discuss that if he is still symptomatic during his trip, seek local medical attention for possible radial styloid cortisone injection.      Relevant Medications   diclofenac (VOLTAREN) 75 MG EC tablet     Orders & Medications Meds ordered this encounter  Medications   diclofenac (VOLTAREN) 75 MG EC tablet    Sig: Take 1 tablet (75 mg total) by mouth 2 (two) times daily as needed.    Dispense:  60 tablet    Refill:  0   No orders of the defined types were placed in this encounter.    Return if symptoms worsen or fail to improve.     [place], MD   Primary Care Sports Medicine [facility]

## 2022-05-29 NOTE — Patient Instructions (Signed)
-   Finish out remaining steroids - Start diclofenac twice daily, continue for 2 weeks (take with food) - After 2 weeks dose diclofenac twice daily on as-needed basis - Use wrist brace throughout the day x2 weeks

## 2022-06-04 ENCOUNTER — Ambulatory Visit: Payer: Self-pay

## 2022-06-04 DIAGNOSIS — R079 Chest pain, unspecified: Secondary | ICD-10-CM | POA: Diagnosis not present

## 2022-06-04 DIAGNOSIS — R0789 Other chest pain: Secondary | ICD-10-CM | POA: Diagnosis not present

## 2022-06-04 NOTE — Telephone Encounter (Signed)
  Chief Complaint: mid chest pressure Symptoms: dizziness, nausea, pain to left neck, pressure is constant now Frequency: Since Monday Pertinent Negatives: Patient denies vomiting, sweating, fever, difficulty breathing,cough Disposition: [x] ED /[] Urgent Care (no appt availability in office) / [] Appointment(In office/virtual)/ []  [place] [facility]/ [] Home Care/ [] Refused Recommended Disposition /[] [place] [facility]/ []  Follow-up with PCP Additional Notes: Pt to go to nearest ED ([place]) Reason for Disposition  Dizziness or lightheadedness  Answer Assessment - Initial Assessment Questions 1. LOCATION: "Where does it hurt?"       Pressure above sternum 2. RADIATION: "Does the pain go anywhere else?" (e.g., into neck, jaw, arms, back)     Neck worse 1 week ago  3. ONSET: "When did the chest pain begin?" (Minutes, hours or days)      Monday 4. PATTERN: "Does the pain come and go, or has it been constant since it started?"  "Does it get worse with exertion?"      Constant-no 5. DURATION: "How long does it last" (e.g., seconds, minutes, hours)     constant 6. SEVERITY: "How bad is the pain?"  (e.g., Scale 1-10; mild, moderate, or severe)    - MILD (1-3): doesn't interfere with normal activities     - MODERATE (4-7): interferes with normal activities or awakens from sleep    - SEVERE (8-10): excruciating pain, unable to do any normal activities      mild 7. CARDIAC RISK FACTORS: "Do you have any history of heart problems or risk factors for heart disease?" (e.g., angina, prior heart attack; diabetes, high blood pressure, high cholesterol, smoker, or strong family history of heart disease)     No-no 8. PULMONARY RISK FACTORS: "Do you have any history of lung disease?"  (e.g., blood clots in lung, asthma, emphysema, birth control pills)     no 9. CAUSE: "What do you think is causing the chest pain?"     Unsure- gas related, anxiety 10. OTHER SYMPTOMS: "Do you have any other  symptoms?" (e.g., dizziness, nausea, vomiting, sweating, fever, difficulty breathing, cough)       nausea  Protocols used: Chest Pain-A-AH

## 2022-06-05 DIAGNOSIS — R079 Chest pain, unspecified: Secondary | ICD-10-CM | POA: Diagnosis not present

## 2023-01-19 ENCOUNTER — Encounter: Payer: Self-pay | Admitting: Family Medicine

## 2023-01-19 ENCOUNTER — Ambulatory Visit (INDEPENDENT_AMBULATORY_CARE_PROVIDER_SITE_OTHER): Payer: BC Managed Care – PPO | Admitting: Family Medicine

## 2023-01-19 VITALS — BP 124/78 | HR 56 | Ht 75.0 in | Wt 203.0 lb

## 2023-01-19 DIAGNOSIS — Z0000 Encounter for general adult medical examination without abnormal findings: Secondary | ICD-10-CM

## 2023-01-19 LAB — HEMOCCULT GUIAC POC 1CARD (OFFICE): Fecal Occult Blood, POC: NEGATIVE

## 2023-01-19 NOTE — Patient Instructions (Signed)
Why follow it? Research shows? ?Those who follow the Mediterranean diet have a reduced risk of heart disease  ?The diet is associated with a reduced incidence of Parkinson's and Alzheimer's diseases ?People following the diet may have longer life expectancies and lower rates of chronic diseases  ?The Dietary Guidelines for Americans recommends the Mediterranean diet as an eating plan to promote health and prevent disease ? ?What Is the Mediterranean Diet?  ?Healthy eating plan based on typical foods and recipes of Mediterranean-style cooking ?The diet is primarily a plant based diet; these foods should make up a majority of meals  ? ?Starches - Plant based foods should make up a majority of meals ?- They are an important sources of vitamins, minerals, energy, antioxidants, and fiber ?- Choose whole grains, foods high in fiber and minimally processed items  ?- Typical grain sources include wheat, oats, barley, corn, brown rice, bulgar, farro, millet, polenta, couscous  ?- Various types of beans include chickpeas, lentils, fava beans, black beans, white beans   ?Fruits  Veggies - Large quantities of antioxidant rich fruits & veggies; 6 or more servings  ?- Vegetables can be eaten raw or lightly drizzled with oil and cooked  ?- Vegetables common to the traditional Mediterranean Diet include: artichokes, arugula, beets, broccoli, brussel sprouts, cabbage, carrots, celery, collard greens, cucumbers, eggplant, kale, leeks, lemons, lettuce, mushrooms, okra, onions, peas, peppers, potatoes, pumpkin, radishes, rutabaga, shallots, spinach, sweet potatoes, turnips, zucchini ?- Fruits common to the Mediterranean Diet include: apples, apricots, avocados, cherries, clementines, dates, figs, grapefruits, grapes, melons, nectarines, oranges, peaches, pears, pomegranates, strawberries, tangerines  ?Fats - Replace butter and margarine with healthy oils, such as olive oil, canola oil, and tahini  ?- Limit nuts to no more than a  handful a day  ?- Nuts include walnuts, almonds, pecans, pistachios, pine nuts  ?- Limit or avoid candied, honey roasted or heavily salted nuts ?- Olives are central to the Mediterranean diet - can be eaten whole or used in a variety of dishes   ?Meats Protein - Limiting red meat: no more than a few times a month ?- When eating red meat: choose lean cuts and keep the portion to the size of deck of cards ?- Eggs: approx. 0 to 4 times a week  ?- Fish and lean poultry: at least 2 a week  ?- Healthy protein sources include, chicken, turkey, lean beef, lamb ?- Increase intake of seafood such as tuna, salmon, trout, mackerel, shrimp, scallops ?- Avoid or limit high fat processed meats such as sausage and bacon  ?Dairy - Include moderate amounts of low fat dairy products  ?- Focus on healthy dairy such as fat free yogurt, skim milk, low or reduced fat cheese ?- Limit dairy products higher in fat such as whole or 2% milk, cheese, ice cream  ?Alcohol - Moderate amounts of red wine is ok  ?- No more than 5 oz daily for women (all ages) and men older than age 65  ?- No more than 10 oz of wine daily for men younger than 65  ?Other - Limit sweets and other desserts  ?- Use herbs and spices instead of salt to flavor foods  ?- Herbs and spices common to the traditional Mediterranean Diet include: basil, bay leaves, chives, cloves, cumin, fennel, garlic, lavender, marjoram, mint, oregano, parsley, pepper, rosemary, sage, savory, sumac, tarragon, thyme  ? ?It?s not just a diet, it?s a lifestyle:  ?The Mediterranean diet includes lifestyle factors typical of those in the   region  ?Foods, drinks and meals are best eaten with others and savored ?Daily physical activity is important for overall good health ?This could be strenuous exercise like running and aerobics ?This could also be more leisurely activities such as walking, housework, yard-work, or taking the stairs ?Moderation is the key; a balanced and healthy diet accommodates most  foods and drinks ?Consider portion sizes and frequency of consumption of certain foods  ? ?Meal Ideas & Options:  ?Breakfast:  ?Whole wheat toast or whole wheat English muffins with peanut butter & hard boiled egg ?Steel cut oats topped with apples & cinnamon and skim milk  ?Fresh fruit: banana, strawberries, melon, berries, peaches  ?Smoothies: strawberries, bananas, greek yogurt, peanut butter ?Low fat greek yogurt with blueberries and granola  ?Egg white omelet with spinach and mushrooms ?Breakfast couscous: whole wheat couscous, apricots, skim milk, cranberries  ?Sandwiches:  ?Hummus and grilled vegetables (peppers, zucchini, squash) on whole wheat bread   ?Grilled chicken on whole wheat pita with lettuce, tomatoes, cucumbers or tzatziki  ?Tuna salad on whole wheat bread: tuna salad made with greek yogurt, olives, red peppers, capers, green onions ?Garlic rosemary lamb pita: lamb saut?ed with garlic, rosemary, salt & pepper; add lettuce, cucumber, greek yogurt to pita - flavor with lemon juice and black pepper  ?Seafood:  ?Mediterranean grilled salmon, seasoned with garlic, basil, parsley, lemon juice and black pepper ?Shrimp, lemon, and spinach whole-grain pasta salad made with low fat greek yogurt  ?Seared scallops with lemon orzo  ?Seared tuna steaks seasoned salt, pepper, coriander topped with tomato mixture of olives, tomatoes, olive oil, minced garlic, parsley, green onions and cappers  ?Meats:  ?Herbed greek chicken salad with kalamata olives, cucumber, feta  ?Red bell peppers stuffed with spinach, bulgur, lean ground beef (or lentils) & topped with feta   ?Kebabs: skewers of chicken, tomatoes, onions, zucchini, squash  ?Turkey burgers: made with red onions, mint, dill, lemon juice, feta cheese topped with roasted red peppers ?Vegetarian ?Cucumber salad: cucumbers, artichoke hearts, celery, red onion, feta cheese, tossed in olive oil & lemon juice  ?Hummus and whole grain pita points with a greek salad  (lettuce, tomato, feta, olives, cucumbers, red onion) ?Lentil soup with celery, carrots made with vegetable broth, garlic, salt and pepper  ?Tabouli salad: parsley, bulgur, mint, scallions, cucumbers, tomato, radishes, lemon juice, olive oil, salt and pepper. ?     ?

## 2023-01-19 NOTE — Progress Notes (Signed)
Date:  01/19/2023   Name:  Troy Logan   DOB:  [birth year]   MRN:  [id number]   Chief Complaint: Annual Exam  Patient is a 37 year old male who presents for a comprehensive physical exam. The patient reports the following problems: none. Health maintenance has been reviewed up to date.      Lab Results  Component Value Date   NA 148 (H) 01/15/2022   K 4.6 01/15/2022   CO2 26 01/15/2022   GLUCOSE 88 01/15/2022   BUN 10 01/15/2022   CREATININE 0.97 01/15/2022   CALCIUM 9.5 01/15/2022   EGFR 104 01/15/2022   GFRNONAA 100 12/07/2020   Lab Results  Component Value Date   CHOL 181 01/15/2022   HDL 66 01/15/2022   LDLCALC 104 (H) 01/15/2022   TRIG 56 01/15/2022   No results found for: "TSH" No results found for: "HGBA1C" Lab Results  Component Value Date   WBC 4.1 01/15/2022   HGB 14.5 01/15/2022   HCT 41.0 01/15/2022   MCV 91 01/15/2022   PLT 224 01/15/2022   Lab Results  Component Value Date   ALT 23 01/15/2022   AST 20 01/15/2022   ALKPHOS 71 01/15/2022   BILITOT 2.1 (H) 01/15/2022   No results found for: "25OHVITD2", "25OHVITD3", "VD25OH"   Review of Systems  Constitutional:  Negative for unexpected weight change.  HENT:  Negative for trouble swallowing.   Eyes:  Negative for visual disturbance.  Respiratory:  Negative for shortness of breath.   Cardiovascular:  Negative for chest pain, palpitations and leg swelling.  Gastrointestinal:  Negative for abdominal pain.  Endocrine: Negative for polydipsia and polyuria.  Genitourinary:  Negative for difficulty urinating and hematuria.    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  Current Meds  Medication Sig   Cyanocobalamin (VITAMIN B 12 PO) Take by mouth as needed.       01/19/2023    8:40 AM 05/26/2022    4:15 PM 01/15/2022    8:55 AM 07/04/2021    1:37 PM  GAD 7 : Generalized Anxiety Score  Nervous, Anxious, on Edge 0 0 0 1  Control/stop worrying 0 0 0 0  Worry too much - different things 0 0 0 0  Trouble relaxing 0 0 0 1  Restless 0 0 0 0  Easily annoyed or irritable 0 0 0 1  Afraid - awful might happen 0 0 0 0  Total GAD 7 Score 0 0 0 3  Anxiety Difficulty Not difficult at all Not difficult at all Not difficult at all Not difficult at all       01/19/2023    8:40 AM 05/26/2022    4:15 PM 01/15/2022    8:55 AM  Depression screen PHQ 2/9  Decreased Interest 0 0 0  Down, Depressed, Hopeless 0 0 0  PHQ - 2 Score 0 0 0  Altered sleeping 0  1 0  Tired, decreased energy 0 0 0  Change in appetite 0 1 0  Feeling bad or failure about yourself  0 0 0  Trouble concentrating 0 0 0  Moving slowly or fidgety/restless 0 0 0  Suicidal thoughts 0 0 0  PHQ-9 Score 0 2 0  Difficult doing work/chores Not difficult at all Not difficult at all Not difficult at all    BP Readings from Last 3 Encounters:  01/19/23 124/78  05/29/22 110/78  05/26/22 100/70    Physical Exam Vitals and nursing note reviewed.  Constitutional:      Appearance: He is well-developed, well-groomed and normal weight.  HENT:     Head: Normocephalic.     Jaw: There is normal jaw occlusion.     Right Ear: Hearing, tympanic membrane, ear canal and external ear normal.     Left Ear: Hearing, tympanic membrane, ear canal and external ear normal.     Nose: Nose normal. No congestion or rhinorrhea.     Mouth/Throat:     Lips: Pink.     Mouth: Mucous membranes are moist. No oral lesions.     Dentition:  Normal dentition.     Tongue: No lesions.     Palate: No mass.     Pharynx: Oropharynx is clear.     Tonsils: No tonsillar exudate.  Eyes:     General: Lids are normal. Gaze aligned appropriately. No scleral icterus.       Right eye: No discharge.        Left eye: No discharge.     Extraocular Movements: Extraocular movements intact.     Conjunctiva/sclera: Conjunctivae normal.     Pupils: Pupils are equal, round, and reactive to light.     Funduscopic exam:    Right eye: Red reflex present.        Left eye: Red reflex present. Neck:     Thyroid: No thyroid mass, thyromegaly or thyroid tenderness.     Vascular: Normal carotid pulses. No carotid bruit, hepatojugular reflux or JVD.     Trachea: Trachea and phonation normal. No tracheal deviation.  Cardiovascular:     Rate and Rhythm: Normal rate and regular rhythm.     Chest Wall: PMI is not displaced.     Pulses:          Carotid pulses are 2+ on the right side and 2+ on the left side.      Radial pulses are 2+ on the right side and 2+ on the left side.       Femoral pulses are 2+ on the right side and 2+ on the left side.      Popliteal pulses are 2+ on the right side and 2+ on the left side.       Dorsalis pedis pulses are 2+ on the right side and 2+ on the left side.       Posterior tibial pulses are 2+ on the right side and 2+ on the left side.     Heart sounds: Normal heart sounds, S1 normal and S2 normal. No murmur heard.    No systolic murmur is present.     No diastolic murmur is present.     No friction rub. No gallop. No S3 or S4 sounds.  Pulmonary:     Effort: No respiratory distress.     Breath sounds: Normal breath sounds. No decreased breath sounds, wheezing, rhonchi or rales.  Chest:     Chest wall: No  tenderness.  Breasts:    Right: Normal. No mass.     Left: Normal. No mass.  Abdominal:     General: Bowel sounds are normal.     Palpations: Abdomen is soft. There is no hepatomegaly, splenomegaly or mass.      Tenderness: There is no abdominal tenderness. There is no guarding or rebound.  Genitourinary:    Pubic Area: No rash.      Penis: Normal and uncircumcised.      Testes: Normal.     Epididymis:     Right: Normal.     Left: Normal.     Prostate: Normal. Not enlarged, not tender and no nodules present.     Rectum: Normal. Guaiac result negative. No mass.  Musculoskeletal:        General: No tenderness. Normal range of motion.     Cervical back: Full passive range of motion without pain, normal range of motion and neck supple.  Lymphadenopathy:     Head:     Right side of head: No submental, submandibular or tonsillar adenopathy.     Left side of head: No submental, submandibular or tonsillar adenopathy.     Cervical: No cervical adenopathy.     Right cervical: No superficial, deep or posterior cervical adenopathy.    Left cervical: No superficial, deep or posterior cervical adenopathy.     Upper Body:     Right upper body: No supraclavicular or axillary adenopathy.     Left upper body: No supraclavicular or axillary adenopathy.     Lower Body: No right inguinal adenopathy. No left inguinal adenopathy.  Skin:    General: Skin is warm.     Findings: No rash.  Neurological:     General: No focal deficit present.     Mental Status: He is alert and oriented to person, place, and time.     Cranial Nerves: Cranial nerves 2-12 are intact. No cranial nerve deficit.     Sensory: Sensation is intact.     Motor: Motor function is intact.     Coordination: Romberg sign negative.     Deep Tendon Reflexes: Reflexes are normal and symmetric.  Psychiatric:        Behavior: Behavior is cooperative.     Wt Readings from Last 3 Encounters:  01/19/23 203 lb (92.1 kg)  05/29/22 203 lb (92.1 kg)  05/26/22 203 lb (92.1 kg)    BP 124/78   Pulse (!) 56   Ht '6\' 3"'$  (1.905 m)   Wt 203 lb (92.1 kg)   SpO2 97%   BMI 25.37 kg/m   Assessment and Plan: Dushaun Nisbet is a 37 y.o. male who presents  today for his Complete Annual Exam. He feels well. He reports exercising runs. He reports he is sleeping fairly well.  Immunizations are reviewed and recommendations provided.   Age appropriate screening tests are discussed. Counseling given for risk factor reduction interventions.   1. Annual physical exam No subjective/objective concerns noted during history of present illness, review of past medical history and medications, review of systems, and physical exam.  Will obtain lipid panel and CMP.  Hemoccult was noted negative - Lipid Panel With LDL/HDL Ratio - Comprehensive Metabolic Panel (CMET) - POCT Occult Blood Stool   Otilio Miu, MD

## 2023-01-20 LAB — COMPREHENSIVE METABOLIC PANEL
ALT: 24 IU/L (ref 0–44)
AST: 16 IU/L (ref 0–40)
Albumin/Globulin Ratio: 2.8 — ABNORMAL HIGH (ref 1.2–2.2)
Albumin: 5.1 g/dL (ref 4.1–5.1)
Alkaline Phosphatase: 89 IU/L (ref 44–121)
BUN/Creatinine Ratio: 10 (ref 9–20)
BUN: 10 mg/dL (ref 6–20)
Bilirubin Total: 1.6 mg/dL — ABNORMAL HIGH (ref 0.0–1.2)
CO2: 23 mmol/L (ref 20–29)
Calcium: 9.7 mg/dL (ref 8.7–10.2)
Chloride: 101 mmol/L (ref 96–106)
Creatinine, Ser: 0.97 mg/dL (ref 0.76–1.27)
Globulin, Total: 1.8 g/dL (ref 1.5–4.5)
Glucose: 86 mg/dL (ref 70–99)
Potassium: 4.6 mmol/L (ref 3.5–5.2)
Sodium: 140 mmol/L (ref 134–144)
Total Protein: 6.9 g/dL (ref 6.0–8.5)
eGFR: 104 mL/min/{1.73_m2} (ref >59)

## 2023-01-20 LAB — LIPID PANEL WITH LDL/HDL RATIO
Cholesterol, Total: 196 mg/dL (ref 100–199)
HDL: 68 mg/dL (ref >39)
LDL Chol Calc (NIH): 118 mg/dL — ABNORMAL HIGH (ref 0–99)
LDL/HDL Ratio: 1.7 ratio (ref 0.0–3.6)
Triglycerides: 55 mg/dL (ref 0–149)
VLDL Cholesterol Cal: 10 mg/dL (ref 5–40)

## 2023-12-09 DIAGNOSIS — Z113 Encounter for screening for infections with a predominantly sexual mode of transmission: Secondary | ICD-10-CM | POA: Diagnosis not present

## 2023-12-09 DIAGNOSIS — Z1159 Encounter for screening for other viral diseases: Secondary | ICD-10-CM | POA: Diagnosis not present

## 2023-12-10 DIAGNOSIS — Z3141 Encounter for fertility testing: Secondary | ICD-10-CM | POA: Diagnosis not present

## 2024-01-27 DIAGNOSIS — Z23 Encounter for immunization: Secondary | ICD-10-CM | POA: Diagnosis not present

## 2024-01-27 DIAGNOSIS — Z0000 Encounter for general adult medical examination without abnormal findings: Secondary | ICD-10-CM | POA: Diagnosis not present

## 2024-01-27 DIAGNOSIS — Z79899 Other long term (current) drug therapy: Secondary | ICD-10-CM | POA: Diagnosis not present

## 2024-02-18 DIAGNOSIS — R0600 Dyspnea, unspecified: Secondary | ICD-10-CM | POA: Diagnosis not present
